# Patient Record
Sex: Male | Born: 1955 | Race: White | Hispanic: No | Marital: Single | Smoking: Never smoker
Health system: Southern US, Community
[De-identification: ages and names within clinical notes are randomized; demographics above are authoritative.]

## PROBLEM LIST (undated history)

## (undated) DIAGNOSIS — F419 Anxiety disorder, unspecified: Secondary | ICD-10-CM

## (undated) DIAGNOSIS — I10 Essential (primary) hypertension: Secondary | ICD-10-CM

## (undated) DIAGNOSIS — E119 Type 2 diabetes mellitus without complications: Secondary | ICD-10-CM

## (undated) DIAGNOSIS — L409 Psoriasis, unspecified: Secondary | ICD-10-CM

## (undated) DIAGNOSIS — F32A Depression, unspecified: Secondary | ICD-10-CM

## (undated) DIAGNOSIS — E109 Type 1 diabetes mellitus without complications: Secondary | ICD-10-CM

## (undated) HISTORY — PX: OTHER SURGICAL HISTORY: SHX169

## (undated) NOTE — *Deleted (*Deleted)
@  2000 discharge instructions given to pt and all questions answered. Pt presently changing and then awaiting his ride to confirm they are at hospital main entrance prior to NT transferring him downstairs for discharge.

---

## 2006-04-17 ENCOUNTER — Emergency Department (HOSPITAL_COMMUNITY): Admission: EM | Admit: 2006-04-17 | Discharge: 2006-04-17 | Payer: Self-pay | Admitting: Emergency Medicine

## 2006-05-10 ENCOUNTER — Emergency Department (HOSPITAL_COMMUNITY): Admission: EM | Admit: 2006-05-10 | Discharge: 2006-05-10 | Payer: Self-pay | Admitting: Emergency Medicine

## 2009-01-08 ENCOUNTER — Emergency Department (HOSPITAL_COMMUNITY): Admission: EM | Admit: 2009-01-08 | Discharge: 2009-01-08 | Payer: Self-pay | Admitting: Emergency Medicine

## 2009-08-20 ENCOUNTER — Emergency Department (HOSPITAL_COMMUNITY): Admission: EM | Admit: 2009-08-20 | Discharge: 2009-08-21 | Payer: Self-pay | Admitting: Emergency Medicine

## 2011-01-15 LAB — URINALYSIS, ROUTINE W REFLEX MICROSCOPIC
Glucose, UA: 100 mg/dL — AB
Specific Gravity, Urine: 1.008 (ref 1.005–1.030)
pH: 6.5 (ref 5.0–8.0)

## 2011-01-15 LAB — GLUCOSE, CAPILLARY
Glucose-Capillary: 122 mg/dL — ABNORMAL HIGH (ref 70–99)
Glucose-Capillary: 21 mg/dL — CL (ref 70–99)

## 2011-01-15 LAB — DIFFERENTIAL
Basophils Absolute: 0.2 10*3/uL — ABNORMAL HIGH (ref 0.0–0.1)
Eosinophils Relative: 4 % (ref 0–5)
Lymphocytes Relative: 29 % (ref 12–46)
Monocytes Absolute: 0.6 10*3/uL (ref 0.1–1.0)
Monocytes Relative: 6 % (ref 3–12)

## 2011-01-15 LAB — COMPREHENSIVE METABOLIC PANEL
AST: 31 U/L (ref 0–37)
Albumin: 3.8 g/dL (ref 3.5–5.2)
Alkaline Phosphatase: 71 U/L (ref 39–117)
Chloride: 98 mEq/L (ref 96–112)
GFR calc Af Amer: >60 mL/min (ref >60)
Potassium: 3.1 mEq/L — ABNORMAL LOW (ref 3.5–5.1)
Total Bilirubin: 1.3 mg/dL — ABNORMAL HIGH (ref 0.3–1.2)
Total Protein: 6.9 g/dL (ref 6.0–8.3)

## 2011-01-15 LAB — ETHANOL: Alcohol, Ethyl (B): 76 mg/dL — ABNORMAL HIGH (ref 0–10)

## 2011-01-15 LAB — CBC
Platelets: 181 10*3/uL (ref 150–400)
RDW: 14.7 % (ref 11.5–15.5)
WBC: 9.2 10*3/uL (ref 4.0–10.5)

## 2011-01-23 LAB — DIFFERENTIAL
Basophils Absolute: 0.1 10*3/uL (ref 0.0–0.1)
Eosinophils Relative: 2 % (ref 0–5)
Lymphocytes Relative: 18 % (ref 12–46)
Lymphs Abs: 1.5 10*3/uL (ref 0.7–4.0)
Monocytes Absolute: 0.7 10*3/uL (ref 0.1–1.0)
Monocytes Relative: 9 % (ref 3–12)
Neutro Abs: 5.7 10*3/uL (ref 1.7–7.7)

## 2011-01-23 LAB — CBC
HCT: 51.9 % (ref 39.0–52.0)
Hemoglobin: 17.7 g/dL — ABNORMAL HIGH (ref 13.0–17.0)
RBC: 5.2 MIL/uL (ref 4.22–5.81)
WBC: 8.3 10*3/uL (ref 4.0–10.5)

## 2011-01-23 LAB — POCT I-STAT, CHEM 8
BUN: 8 mg/dL (ref 6–23)
Calcium, Ion: 1.1 mmol/L — ABNORMAL LOW (ref 1.12–1.32)
Chloride: 95 mEq/L — ABNORMAL LOW (ref 96–112)
Creatinine, Ser: 0.8 mg/dL (ref 0.4–1.5)
TCO2: 26 mmol/L (ref 0–100)

## 2011-01-23 LAB — BASIC METABOLIC PANEL
Calcium: 9.3 mg/dL (ref 8.4–10.5)
GFR calc Af Amer: >60 mL/min (ref >60)
GFR calc non Af Amer: >60 mL/min (ref >60)
Glucose, Bld: 303 mg/dL — ABNORMAL HIGH (ref 70–99)
Potassium: 4.2 mEq/L (ref 3.5–5.1)
Sodium: 134 mEq/L — ABNORMAL LOW (ref 135–145)

## 2011-01-23 LAB — TRICYCLICS SCREEN, URINE: TCA Scrn: NOT DETECTED

## 2011-01-23 LAB — ETHANOL: Alcohol, Ethyl (B): <5 mg/dL (ref 0–10)

## 2011-01-23 LAB — RAPID URINE DRUG SCREEN, HOSP PERFORMED
Cocaine: NOT DETECTED
Tetrahydrocannabinol: POSITIVE — AB

## 2020-08-06 ENCOUNTER — Other Ambulatory Visit: Payer: Self-pay

## 2020-08-06 ENCOUNTER — Inpatient Hospital Stay (HOSPITAL_COMMUNITY)
Admission: EM | Admit: 2020-08-06 | Discharge: 2020-08-09 | DRG: 271 | Disposition: A | Discharge status: Home / Self Care | Payer: Self-pay | Attending: Internal Medicine | Admitting: Internal Medicine

## 2020-08-06 ENCOUNTER — Encounter (HOSPITAL_COMMUNITY): Payer: Self-pay

## 2020-08-06 ENCOUNTER — Emergency Department (HOSPITAL_COMMUNITY): Payer: Self-pay

## 2020-08-06 DIAGNOSIS — I96 Gangrene, not elsewhere classified: Secondary | ICD-10-CM

## 2020-08-06 DIAGNOSIS — F32A Depression, unspecified: Secondary | ICD-10-CM

## 2020-08-06 DIAGNOSIS — Z825 Family history of asthma and other chronic lower respiratory diseases: Secondary | ICD-10-CM

## 2020-08-06 DIAGNOSIS — I739 Peripheral vascular disease, unspecified: Secondary | ICD-10-CM

## 2020-08-06 DIAGNOSIS — I70221 Atherosclerosis of native arteries of extremities with rest pain, right leg: Secondary | ICD-10-CM | POA: Diagnosis present

## 2020-08-06 DIAGNOSIS — W208XXA Other cause of strike by thrown, projected or falling object, initial encounter: Secondary | ICD-10-CM | POA: Diagnosis present

## 2020-08-06 DIAGNOSIS — L97509 Non-pressure chronic ulcer of other part of unspecified foot with unspecified severity: Secondary | ICD-10-CM

## 2020-08-06 DIAGNOSIS — E10621 Type 1 diabetes mellitus with foot ulcer: Secondary | ICD-10-CM | POA: Diagnosis present

## 2020-08-06 DIAGNOSIS — E10649 Type 1 diabetes mellitus with hypoglycemia without coma: Secondary | ICD-10-CM | POA: Diagnosis present

## 2020-08-06 DIAGNOSIS — E1052 Type 1 diabetes mellitus with diabetic peripheral angiopathy with gangrene: Principal | ICD-10-CM | POA: Diagnosis present

## 2020-08-06 DIAGNOSIS — L03115 Cellulitis of right lower limb: Secondary | ICD-10-CM | POA: Diagnosis present

## 2020-08-06 DIAGNOSIS — L409 Psoriasis, unspecified: Secondary | ICD-10-CM | POA: Diagnosis present

## 2020-08-06 DIAGNOSIS — Z888 Allergy status to other drugs, medicaments and biological substances status: Secondary | ICD-10-CM

## 2020-08-06 DIAGNOSIS — Z833 Family history of diabetes mellitus: Secondary | ICD-10-CM

## 2020-08-06 DIAGNOSIS — Z79899 Other long term (current) drug therapy: Secondary | ICD-10-CM

## 2020-08-06 DIAGNOSIS — E1151 Type 2 diabetes mellitus with diabetic peripheral angiopathy without gangrene: Secondary | ICD-10-CM

## 2020-08-06 DIAGNOSIS — Z23 Encounter for immunization: Secondary | ICD-10-CM

## 2020-08-06 DIAGNOSIS — L600 Ingrowing nail: Secondary | ICD-10-CM | POA: Diagnosis present

## 2020-08-06 DIAGNOSIS — Z20822 Contact with and (suspected) exposure to covid-19: Secondary | ICD-10-CM | POA: Diagnosis present

## 2020-08-06 DIAGNOSIS — L0390 Cellulitis, unspecified: Secondary | ICD-10-CM | POA: Diagnosis present

## 2020-08-06 DIAGNOSIS — Z794 Long term (current) use of insulin: Secondary | ICD-10-CM

## 2020-08-06 DIAGNOSIS — L97519 Non-pressure chronic ulcer of other part of right foot with unspecified severity: Secondary | ICD-10-CM | POA: Diagnosis present

## 2020-08-06 HISTORY — DX: Type 1 diabetes mellitus without complications: E10.9

## 2020-08-06 HISTORY — DX: Type 2 diabetes mellitus without complications: E11.9

## 2020-08-06 HISTORY — DX: Psoriasis, unspecified: L40.9

## 2020-08-06 HISTORY — DX: Depression, unspecified: F32.A

## 2020-08-06 LAB — COMPREHENSIVE METABOLIC PANEL
ALT: 19 U/L (ref 0–44)
AST: 22 U/L (ref 15–41)
Albumin: 3.8 g/dL (ref 3.5–5.0)
Alkaline Phosphatase: 83 U/L (ref 38–126)
Anion gap: 13 (ref 5–15)
BUN: 12 mg/dL (ref 8–23)
CO2: 20 mmol/L — ABNORMAL LOW (ref 22–32)
Calcium: 9.1 mg/dL (ref 8.9–10.3)
Chloride: 101 mmol/L (ref 98–111)
Creatinine, Ser: 1.26 mg/dL — ABNORMAL HIGH (ref 0.61–1.24)
GFR, Estimated: >60 mL/min (ref >60)
Glucose, Bld: 150 mg/dL — ABNORMAL HIGH (ref 70–99)
Potassium: 3.9 mmol/L (ref 3.5–5.1)
Sodium: 134 mmol/L — ABNORMAL LOW (ref 135–145)
Total Bilirubin: 1.1 mg/dL (ref 0.3–1.2)
Total Protein: 6.6 g/dL (ref 6.5–8.1)

## 2020-08-06 LAB — RESPIRATORY PANEL BY RT PCR (FLU A&B, COVID)
Influenza A by PCR: NEGATIVE
Influenza B by PCR: NEGATIVE
SARS Coronavirus 2 by RT PCR: NEGATIVE

## 2020-08-06 LAB — CBC WITH DIFFERENTIAL/PLATELET
Abs Immature Granulocytes: 0.05 10*3/uL (ref 0.00–0.07)
Basophils Absolute: 0.2 10*3/uL — ABNORMAL HIGH (ref 0.0–0.1)
Basophils Relative: 2 %
Eosinophils Absolute: 0.6 10*3/uL — ABNORMAL HIGH (ref 0.0–0.5)
Eosinophils Relative: 5 %
HCT: 48.2 % (ref 39.0–52.0)
Hemoglobin: 16.1 g/dL (ref 13.0–17.0)
Immature Granulocytes: 0 %
Lymphocytes Relative: 16 %
Lymphs Abs: 1.8 10*3/uL (ref 0.7–4.0)
MCH: 30.4 pg (ref 26.0–34.0)
MCHC: 33.4 g/dL (ref 30.0–36.0)
MCV: 91.1 fL (ref 80.0–100.0)
Monocytes Absolute: 0.9 10*3/uL (ref 0.1–1.0)
Monocytes Relative: 8 %
Neutro Abs: 8 10*3/uL — ABNORMAL HIGH (ref 1.7–7.7)
Neutrophils Relative %: 69 %
Platelets: 346 10*3/uL (ref 150–400)
RBC: 5.29 MIL/uL (ref 4.22–5.81)
RDW: 12.7 % (ref 11.5–15.5)
WBC: 11.6 10*3/uL — ABNORMAL HIGH (ref 4.0–10.5)
nRBC: 0 % (ref 0.0–0.2)

## 2020-08-06 LAB — GLUCOSE, CAPILLARY
Glucose-Capillary: 47 mg/dL — ABNORMAL LOW (ref 70–99)
Glucose-Capillary: 57 mg/dL — ABNORMAL LOW (ref 70–99)
Glucose-Capillary: 113 mg/dL — ABNORMAL HIGH (ref 70–99)

## 2020-08-06 LAB — LACTIC ACID, PLASMA: Lactic Acid, Venous: 1.5 mmol/L (ref 0.5–1.9)

## 2020-08-06 LAB — MRSA PCR SCREENING: MRSA by PCR: NEGATIVE

## 2020-08-06 MED ORDER — SODIUM CHLORIDE 0.9 % IV SOLN: 2.0000 g | Freq: Once | INTRAVENOUS | Status: DC

## 2020-08-06 MED ORDER — LACTATED RINGERS IV BOLUS
1000.0000 mL | Freq: Once | INTRAVENOUS | Status: AC
  Administered 2020-08-06: 1000 mL via INTRAVENOUS

## 2020-08-06 MED ORDER — SODIUM CHLORIDE 0.9 % IV SOLN
2.0000 g | Freq: Three times a day (TID) | INTRAVENOUS | Status: DC
  Administered 2020-08-06 – 2020-08-09 (×9): 2 g via INTRAVENOUS
  Filled 2020-08-06 (×9): qty 2

## 2020-08-06 MED ORDER — VANCOMYCIN HCL 1750 MG/350ML IV SOLN
1750.0000 mg | Freq: Once | INTRAVENOUS | Status: AC
  Administered 2020-08-06: 1750 mg via INTRAVENOUS
  Filled 2020-08-06: qty 350

## 2020-08-06 MED ORDER — ONDANSETRON HCL 4 MG PO TABS: 4.0000 mg | ORAL_TABLET | Freq: Four times a day (QID) | ORAL | Status: DC | PRN

## 2020-08-06 MED ORDER — ADULT MULTIVITAMIN W/MINERALS CH
1.0000 | ORAL_TABLET | Freq: Every day | ORAL | Status: DC
  Administered 2020-08-07 – 2020-08-09 (×3): 1 via ORAL
  Filled 2020-08-06 (×3): qty 1

## 2020-08-06 MED ORDER — INFLUENZA VAC SPLIT QUAD 0.5 ML IM SUSY
0.5000 mL | PREFILLED_SYRINGE | INTRAMUSCULAR | Status: DC
  Filled 2020-08-06: qty 0.5

## 2020-08-06 MED ORDER — IBUPROFEN 400 MG PO TABS
400.0000 mg | ORAL_TABLET | ORAL | Status: DC | PRN
  Administered 2020-08-07 – 2020-08-09 (×8): 400 mg via ORAL
  Filled 2020-08-06 (×9): qty 1

## 2020-08-06 MED ORDER — MORPHINE SULFATE (PF) 2 MG/ML IV SOLN
1.0000 mg | INTRAVENOUS | Status: DC | PRN
  Administered 2020-08-06 – 2020-08-07 (×3): 1 mg via INTRAVENOUS
  Filled 2020-08-06 (×3): qty 1

## 2020-08-06 MED ORDER — PNEUMOCOCCAL VAC POLYVALENT 25 MCG/0.5ML IJ INJ
0.5000 mL | INJECTION | INTRAMUSCULAR | Status: DC
  Filled 2020-08-06: qty 0.5

## 2020-08-06 MED ORDER — ONDANSETRON HCL 4 MG/2ML IJ SOLN
4.0000 mg | Freq: Four times a day (QID) | INTRAMUSCULAR | Status: DC | PRN
  Administered 2020-08-09: 4 mg via INTRAVENOUS
  Filled 2020-08-06: qty 2

## 2020-08-06 MED ORDER — INSULIN ASPART 100 UNIT/ML ~~LOC~~ SOLN: 0.0000 [IU] | Freq: Three times a day (TID) | SUBCUTANEOUS | Status: DC

## 2020-08-06 MED ORDER — DIPHENHYDRAMINE HCL 25 MG PO CAPS: 25.0000 mg | ORAL_CAPSULE | Freq: Four times a day (QID) | ORAL | Status: DC | PRN

## 2020-08-06 MED ORDER — ENOXAPARIN SODIUM 40 MG/0.4ML ~~LOC~~ SOLN
40.0000 mg | SUBCUTANEOUS | Status: DC
  Administered 2020-08-06 – 2020-08-08 (×3): 40 mg via SUBCUTANEOUS
  Filled 2020-08-06 (×3): qty 0.4

## 2020-08-06 MED ORDER — VANCOMYCIN HCL 1500 MG/300ML IV SOLN
1500.0000 mg | INTRAVENOUS | Status: DC
  Filled 2020-08-06: qty 300

## 2020-08-06 MED ORDER — ACETAMINOPHEN 325 MG PO TABS
650.0000 mg | ORAL_TABLET | Freq: Four times a day (QID) | ORAL | Status: DC | PRN
  Administered 2020-08-07 – 2020-08-08 (×3): 650 mg via ORAL
  Filled 2020-08-06 (×3): qty 2

## 2020-08-06 MED ORDER — SODIUM CHLORIDE 0.9 % IV SOLN
INTRAVENOUS | Status: DC | PRN
  Administered 2020-08-06 – 2020-08-07 (×2): 250 mL via INTRAVENOUS
  Administered 2020-08-08: 500 mL via INTRAVENOUS

## 2020-08-06 MED ORDER — INSULIN NPH (HUMAN) (ISOPHANE) 100 UNIT/ML ~~LOC~~ SUSP
35.0000 [IU] | Freq: Every day | SUBCUTANEOUS | Status: DC
  Administered 2020-08-07: 35 [IU] via SUBCUTANEOUS
  Filled 2020-08-06: qty 10

## 2020-08-06 MED ORDER — VANCOMYCIN HCL IN DEXTROSE 1-5 GM/200ML-% IV SOLN: 1000.0000 mg | Freq: Once | INTRAVENOUS | Status: DC

## 2020-08-06 MED ORDER — ONDANSETRON HCL 4 MG/2ML IJ SOLN
4.0000 mg | Freq: Once | INTRAMUSCULAR | Status: AC
  Administered 2020-08-06: 4 mg via INTRAVENOUS
  Filled 2020-08-06: qty 2

## 2020-08-06 MED ORDER — PIPERACILLIN-TAZOBACTAM 3.375 G IVPB 30 MIN
3.3750 g | Freq: Once | INTRAVENOUS | Status: AC
  Administered 2020-08-06: 3.375 g via INTRAVENOUS
  Filled 2020-08-06: qty 50

## 2020-08-06 MED ORDER — MORPHINE SULFATE (PF) 4 MG/ML IV SOLN
4.0000 mg | Freq: Once | INTRAVENOUS | Status: AC
  Administered 2020-08-06: 4 mg via INTRAVENOUS
  Filled 2020-08-06: qty 1

## 2020-08-06 MED ORDER — ACETAMINOPHEN 650 MG RE SUPP: 650.0000 mg | Freq: Four times a day (QID) | RECTAL | Status: DC | PRN

## 2020-08-06 NOTE — Progress Notes (Signed)
A consult was received from an ED physician for vancomycin  per pharmacy dosing.  The patient's profile has been reviewed for ht/wt/allergies/indication/available labs.    A one time order has been placed for vancomycin 1750 mg IV x1.  Further antibiotics/pharmacy consults should be ordered by admitting physician if indicated.                       Thank you, Lucia Gaskins 08/06/2020  2:36 PM

## 2020-08-06 NOTE — ED Triage Notes (Signed)
Patient c/o right great toe "being necrotic." Patient has redness of the right foot. Patient states he has been taking Cipro and "another antibiotic." Patient went to an UC and was told to come to the ED.

## 2020-08-06 NOTE — Consult Note (Signed)
Chief Complaint: Necrotic right great toe  History: John Nguyen is a 64 y.o. male, with a history of type I DM, presenting to the ED with concern for infection to the right foot worsening over the last couple weeks.  Patient states he began to have pain in the right foot 2 to 3 weeks ago.  He noted a wound from which he was able to express purulent discharge.   He was seen at urgent care on October 15, prescribed Bactrim and Cipro.  X-ray at that time unremarkable, per radiologist read in Care Everywhere. Patient return to the urgent care today due to spreading redness from the toe into the foot and lower leg as well as development and spreading of area of blackened tissue on the right big toe.  Moderate to severe throbbing pain in the right toe.  He states he did injure his foot, but not recently.  The incident where he dropped a box on his foot occurred about a year ago.  Patient has not had Covid vaccination.  He does not have a primary care provider.   Review of systems Denies fever/chills, abdominal pain, chest pain, other extremity pain/swelling, shortness of breath, N/V/D, or any other complaints.  Refer to past medical history for remainder of pertinent positives.   Past Medical History:  Diagnosis Date  . Depression   . Diabetes mellitus without complication (HCC)   . Psoriasis   . Type 1 diabetes mellitus (HCC)     Allergies  Allergen Reactions  . Cymbalta [Duloxetine Hcl] Rash    No current facility-administered medications on file prior to encounter.   Current Outpatient Medications on File Prior to Encounter  Medication Sig Dispense Refill  . ciprofloxacin (CIPRO) 500 MG tablet Take 500 mg by mouth 2 (two) times daily. Start date : 1015/21    . diphenhydrAMINE (BENADRYL) 25 mg capsule Take 25 mg by mouth every 6 (six) hours as needed for itching or allergies.    Marland Kitchen ibuprofen (ADVIL) 200 MG tablet Take 800 mg by mouth 2 (two) times daily as needed for  moderate pain.    . indomethacin (INDOCIN) 25 MG capsule Take 25 mg by mouth 2 (two) times daily with a meal. Start  Date : 07/27/20    . insulin NPH Human (NOVOLIN N) 100 UNIT/ML injection Inject 35 Units into the skin daily.     . Multiple Vitamin (MULTIVITAMIN WITH MINERALS) TABS tablet Take 1 tablet by mouth daily.    Marland Kitchen sulfamethoxazole-trimethoprim (BACTRIM DS) 800-160 MG tablet Take 1 tablet by mouth 2 (two) times daily. Start date : 07/27/20      Physical Exam: Vitals:   08/06/20 1636 08/06/20 1700  BP: (!) 162/103 (!) 157/93  Pulse: 92 96  Resp: 17 16  Temp:    SpO2: 98% 100%   Body mass index is 27.02 kg/m. He is alert and oriented x3 No shortness of breath or chest pain.  Lungs are clear to auscultation. Cardiac: Regular rate and rhythm no rubs gallops murmurs Abdomen soft and nontender.  No loss of bowel bladder control, no rebound tenderness Right lower extremity: 1+ dorsalis pedis/posterior tibialis pulses.  Positive ecchymosis/erythema starting at the foot and radiating up to about the mid calf.  Compartments are soft and nontender.  Intact EHL/tibialis anterior/gastrocnemius function.  Necrotic black great toe.  Insensate to palpation.  No other open lacerations are appreciated.  Image: DG Foot Complete Right  Result Date: 08/06/2020 CLINICAL DATA:  Cellulitis,  first ray necrosis EXAM: RIGHT FOOT COMPLETE - 3+ VIEW COMPARISON:  Radiograph 07/27/2020 FINDINGS: Soft tissue swelling of the first digit without clear evidence of osteomyelitis. Small cortical erosion noted at the head of the fifth metatarsal, nonspecific in can be seen with sequela of prior infection/osteomyelitis as well as erosive arthropathy. Redemonstrated linear foreign body seen along the plantar aspect at the metatarsal heads and at the plantar aspect of the first interphalangeal joint. Progressive clawtoe deformities of the second through fifth ray. Additional varus deformity of the fifth ray noted,  progressive from prior imaging. IMPRESSION: 1. Soft tissue swelling of the first digit without clear evidence of osteomyelitis. 2. Small cortical erosion noted at the head of the fifth metatarsal, nonspecific in can be seen with sequela of prior infection/osteomyelitis as well as erosive arthropathy. 3. Stable indeterminate foreign bodies in the volar aspect of the first and fifth rays. 4. Quintus varus and progressive clawtoe deformities of the second through fifth ray. Electronically Signed   By: Kreg Shropshire M.D.   On: 08/06/2020 15:52    A/P: John Nguyen is a pleasant 64 year old diabetic with a necrotic great toe for at least 2 weeks now.  Patient states it has been getting worse and so he elected to come to the emergency room today because of concerns for infection.  At this point time patient will more than likely require surgical debridement.  I have contacted Dr. [name] and he will take over care tomorrow and discuss definitive treatment.  Recommend admission to the medical service for IV antibiotic therapy.  If there is any further questions or concerns please do not hesitate to contact me.

## 2020-08-06 NOTE — ED Provider Notes (Signed)
[facility]-EMERGENCY DEPT Provider Note   CSN: [id number] Arrival date & time: 08/06/20  1325     History Chief Complaint  Patient presents with  . Foot Pain    John Nguyen is a 63 y.o. male.  HPI      John Nguyen is a 64 y.o. male, with a history of type I DM, presenting to the ED with concern for infection to the right foot worsening over the last couple weeks.  Patient states he began to have pain in the right foot 2 to 3 weeks ago.  He noted a wound from which he was able to express purulent discharge.   He was seen at urgent care on October 15, prescribed Bactrim and Cipro.  X-ray at that time unremarkable, per radiologist read in Care Everywhere. Patient return to the urgent care today due to spreading redness from the toe into the foot and lower leg as well as development and spreading of area of blackened tissue on the right big toe.  Moderate to severe throbbing pain in the right toe.  He states he did injure his foot, but not recently.  The incident where he dropped a box on his foot occurred about a year ago.  Patient has not had Covid vaccination.  He does not have a primary care provider.  Denies fever/chills, abdominal pain, chest pain, other extremity pain/swelling, shortness of breath, N/V/D, or any other complaints.   Past Medical History:  Diagnosis Date  . Depression   . Diabetes mellitus without complication (HCC)   . Psoriasis   . Type 1 diabetes mellitus [facility])     Patient Active Problem List   Diagnosis Date Noted  . Cellulitis 08/06/2020    Past Surgical History:  Procedure Laterality Date  . arm fracture surgery Left        Family History  Problem Relation Age of Onset  . Diabetes Mother   . Cancer Mother   . COPD Father     Social History   Tobacco Use  . Smoking status: Never Smoker  . Smokeless tobacco: Never Used  Vaping Use  . Vaping Use: Never used  Substance Use Topics  . Alcohol use: Yes     Comment: daily use  . Drug use: Yes    Types: Marijuana    Home Medications Prior to Admission medications   Medication Sig Start Date End Date Taking? Authorizing Provider  ciprofloxacin (CIPRO) 500 MG tablet Take 500 mg by mouth 2 (two) times daily. Start date : 1015/21 07/27/20  Yes [provider]  diphenhydrAMINE (BENADRYL) 25 mg capsule Take 25 mg by mouth every 6 (six) hours as needed for itching or allergies.   Yes [provider]  ibuprofen (ADVIL) 200 MG tablet Take 800 mg by mouth 2 (two) times daily as needed for moderate pain.   Yes [provider]  indomethacin (INDOCIN) 25 MG capsule Take 25 mg by mouth 2 (two) times daily with a meal. Start  Date : 07/27/20 07/27/20  Yes [provider]  insulin NPH Human (NOVOLIN N) 100 UNIT/ML injection Inject 35 Units into the skin daily.    Yes [provider]  Multiple Vitamin (MULTIVITAMIN WITH MINERALS) TABS tablet Take 1 tablet by mouth daily.   Yes [provider]  sulfamethoxazole-trimethoprim (BACTRIM DS) 800-160 MG tablet Take 1 tablet by mouth 2 (two) times daily. Start date : 07/27/20 07/27/20  Yes [provider]    Allergies  Cymbalta [duloxetine hcl]  Review of Systems   Review of Systems  Constitutional: Negative for chills and fever.  Respiratory: Negative for shortness of breath.   Cardiovascular: Negative for chest pain.  Gastrointestinal: Negative for abdominal pain, diarrhea, nausea and vomiting.  Skin: Positive for color change and wound.  Neurological: Positive for numbness. Negative for weakness.  All other systems reviewed and are negative.   Physical Exam Updated Vital Signs BP (!) 173/98 (BP Location: Left Arm)   Pulse (!) 105   Temp 98.3 F (36.8 C) (Oral)   Resp 16   Ht  (1.753 m)   Wt 83 kg   SpO2 100%   BMI 27.02 kg/m   Physical Exam Vitals and nursing note reviewed.  Constitutional:      General: He is not in acute  distress.    Appearance: He is well-developed. He is not diaphoretic.  HENT:     Head: Normocephalic and atraumatic.     Mouth/Throat:     Mouth: Mucous membranes are moist.     Pharynx: Oropharynx is clear.  Eyes:     Conjunctiva/sclera: Conjunctivae normal.  Cardiovascular:     Rate and Rhythm: Normal rate and regular rhythm.     Pulses: Normal pulses.          Radial pulses are 2+ on the right side and 2+ on the left side.       Posterior tibial pulses are 2+ on the right side and 2+ on the left side.     Heart sounds: Normal heart sounds.  Pulmonary:     Effort: Pulmonary effort is normal. No respiratory distress.     Breath sounds: Normal breath sounds.  Abdominal:     Palpations: Abdomen is soft.     Tenderness: There is no abdominal tenderness. There is no guarding.  Musculoskeletal:     Cervical back: Neck supple.     Comments: Blackened area to the right first toe, as shown.  This area is cool to the touch.  Erythema extending throughout the toes, foot, and right lower leg to about mid calf with swelling.  Tenderness in the right great toe.  Skin:    General: Skin is warm and dry.  Neurological:     Mental Status: He is alert.     Comments: No sensation light touch in the tip of the right great toe.  Sensation to light touch present in the proximal right great toe as well as the other toes of the right foot.  Motor function intact in the right toes and ankle.  Psychiatric:        Mood and Affect: Mood and affect normal.        Speech: Speech normal.        Behavior: Behavior normal.                      ED Results / Procedures / Treatments   Labs (all labs ordered are listed, but only abnormal results are displayed) Labs Reviewed  COMPREHENSIVE METABOLIC PANEL - Abnormal; Notable for the following components:      Result Value   Sodium 134 (*)    CO2 20 (*)    Glucose, Bld 150 (*)    Creatinine, Ser 1.26 (*)    All other components within normal  limits  CBC WITH DIFFERENTIAL/PLATELET - Abnormal; Notable for the following components:   WBC 11.6 (*)    Neutro Abs 8.0 (*)  Eosinophils Absolute 0.6 (*)    Basophils Absolute 0.2 (*)    All other components within normal limits  RESPIRATORY PANEL BY RT PCR (FLU A&B, COVID)  MRSA PCR SCREENING  CULTURE, BLOOD (ROUTINE X 2)  CULTURE, BLOOD (ROUTINE X 2)  LACTIC ACID, PLASMA  LACTIC ACID, PLASMA   BUN  Date Value Ref Range Status  08/06/2020 12 8 - 23 mg/dL Final  38/75/6433 11 6 - 23 mg/dL Final  29/51/8841 8 6 - 23 mg/dL Final  66/03/3015 8 6 - 23 mg/dL Final   Creatinine, Ser  Date Value Ref Range Status  08/06/2020 1.26 (H) 0.61 - 1.24 mg/dL Final  10/21/3233 5.73 0.40 - 1.50 mg/dL Final  22/11/5425 0.8 0.62 - 1.50 mg/dL Final  37/62/8315 1.76 0.40 - 1.50 mg/dL Final     EKG None  Radiology DG Foot Complete Right  Result Date: 08/06/2020 CLINICAL DATA:  Cellulitis, first ray necrosis EXAM: RIGHT FOOT COMPLETE - 3+ VIEW COMPARISON:  Radiograph 07/27/2020 FINDINGS: Soft tissue swelling of the first digit without clear evidence of osteomyelitis. Small cortical erosion noted at the head of the fifth metatarsal, nonspecific in can be seen with sequela of prior infection/osteomyelitis as well as erosive arthropathy. Redemonstrated linear foreign body seen along the plantar aspect at the metatarsal heads and at the plantar aspect of the first interphalangeal joint. Progressive clawtoe deformities of the second through fifth ray. Additional varus deformity of the fifth ray noted, progressive from prior imaging. IMPRESSION: 1. Soft tissue swelling of the first digit without clear evidence of osteomyelitis. 2. Small cortical erosion noted at the head of the fifth metatarsal, nonspecific in can be seen with sequela of prior infection/osteomyelitis as well as erosive arthropathy. 3. Stable indeterminate foreign bodies in the volar aspect of the first and fifth rays. 4. Quintus varus  and progressive clawtoe deformities of the second through fifth ray. Electronically Signed   By: Kreg Shropshire M.D.   On: 08/06/2020 15:52    Procedures Procedures (including critical care time)  Medications Ordered in ED Medications  vancomycin (VANCOREADY) IVPB 1750 mg/350 mL (1,750 mg Intravenous New Bag/Given 08/06/20 1544)  influenza vac split quadrivalent PF (FLUARIX) injection 0.5 mL (has no administration in time range)  pneumococcal 23 valent vaccine (PNEUMOVAX-23) injection 0.5 mL (has no administration in time range)  piperacillin-tazobactam (ZOSYN) IVPB 3.375 g (0 g Intravenous Stopped 08/06/20 1547)  lactated ringers bolus 1,000 mL (1,000 mLs Intravenous New Bag/Given 08/06/20 1513)  morphine 4 MG/ML injection 4 mg (4 mg Intravenous Given 08/06/20 1555)  ondansetron (ZOFRAN) injection 4 mg (4 mg Intravenous Given 08/06/20 1513)    ED Course  I have reviewed the triage vital signs and the nursing notes.  Pertinent labs & imaging results that were available during my care of the patient were reviewed by me and considered in my medical decision making (see chart for details).  Clinical Course as of Aug 06 1708  Mon Aug 06, 2020  1437 Spoke with Dr. Shon Baton, orthopedic surgeon. Discussed patient's symptoms and physical exam findings. He will follow on the patient.    [SJ]  1642 Spoke with Dr. Ella Jubilee, hospitalist. Agrees to admit patient.    [SJ]    Clinical Course User Index [SJ] Skylor Hughson C, PA-C   MDM Rules/Calculators/A&P                          Patient presents with blackening coloration to the right great toe worsening  over the last couple weeks.  He also has worsening and spreading of erythema and swelling, despite antibiotic therapy. Patient is nontoxic appearing, afebrile, not tachycardic on my exam, not tachypneic, not hypotensive, maintains excellent SPO2 on room air, and is in no apparent distress.   I have reviewed the patient's chart to obtain more  information.   I reviewed and interpreted the patient's labs and radiological studies. No definitive evidence of osteomyelitis on x-ray. Mild leukocytosis. Negative for lactic acidosis.  Concern for progressive cellulitis despite antibiotic therapy as well as necrosis.   Findings and plan of care discussed with Melene Plan, DO. Dr. Adela Lank personally evaluated and examined this patient.  Final Clinical Impression(s) / ED Diagnoses Final diagnoses:  Cellulitis of right lower extremity  Skin necrosis [facility])    Rx / DC Orders ED Discharge Orders    None       Concepcion Living 08/06/20 1712    Melene Plan, DO 08/07/20 [phone]

## 2020-08-06 NOTE — Plan of Care (Signed)
  Problem: Education: Goal: Knowledge of General Education information will improve Description Including pain rating scale, medication(s)/side effects and non-pharmacologic comfort measures Outcome: Progressing   

## 2020-08-06 NOTE — H&P (Signed)
History and Physical    John Nguyen OHY:[id number] DOB: [birth year] DOA: 08/06/2020  PCP: Pcp, No   Patient coming from: home / urgent care   Chief Complaint:  Right foot erythema and edema.   HPI: John Nguyen is a 64 y.o. male with medical history significant of type 1 diabetes mellitus.  Patient sustained a bilateral feet injury about 3 weeks ago after a heavy object landed on both feet.  He lost the toenail of his first metatarsals bilaterally.  On the left it regrowth with no problems, on the right it developed progressive edema, erythema and local tenderness.  Apparently he developed a right great toe ingrown nail producing further injury.    He applied locally Neosporin, alcohol and gauze, 7 days later he was able to drain pus material by compressing his first toe.  His symptoms continue to progress, about 11 days after his initial injury he was seen at urgent care clinic, radiographs were taken and he had oral antibiotic prescribed, ciprofloxacin/Bactrim along with ibuprofen. His right foot erythema partially improved but his right great toe continue to worsen, over the last 2 days had necrotic changes at the tip with worsening edema and erythema. Today after completing 10 days of oral antibiotic therapy, he follow-up with urgent care, due to worsening injuries he was referred to the hospital for further evaluation.  He does have pain at his right foot, moderate to severe intensity, worse to touch and ambulation, associated with local erythema, there is no radiation, or improving factors.  He reports subjective chills but no frank fevers, no nausea no vomiting.  His glucose has been elevated lately despite aggressive insulin therapy.   ED Course: patient had IV vancomycin and zosyn, 1000 ml LR IV and referred for admission.   Review of Systems:  1. General: No fevers, positive subjective chills, no weight gain or weight loss 2. ENT: No runny nose or sore throat, no hearing  disturbances 3. Pulmonary: No dyspnea, cough, wheezing, or hemoptysis 4. Cardiovascular: No angina, claudication, lower extremity edema, pnd or orthopnea 5. Gastrointestinal: No nausea or vomiting, no diarrhea or constipation 6. Hematology: No easy bruisability or frequent infections 7. Urology: No dysuria, hematuria or increased urinary frequency 8. Dermatology: right foot injury as mentioned in the HPI.  9. Neurology: No seizures or paresthesias 10. Musculoskeletal: No joint pain or deformities  Past Medical History:  Diagnosis Date  . Depression   . Diabetes mellitus without complication (HCC)   . Psoriasis   . Type 1 diabetes mellitus (HCC)     Past Surgical History:  Procedure Laterality Date  . arm fracture surgery Left      reports that he has never smoked. He has never used smokeless tobacco. He reports current alcohol use. He reports current drug use. Drug: Marijuana.  Allergies  Allergen Reactions  . Cymbalta [Duloxetine Hcl] Rash    Family History  Problem Relation Age of Onset  . Diabetes Mother   . Cancer Mother   . COPD Father      Prior to Admission medications   Medication Sig Start Date End Date Taking? Authorizing Provider  ciprofloxacin (CIPRO) 500 MG tablet Take 500 mg by mouth 2 (two) times daily. Start date : 1015/21 07/27/20  Yes [provider]  diphenhydrAMINE (BENADRYL) 25 mg capsule Take 25 mg by mouth every 6 (six) hours as needed for itching or allergies.   Yes [provider]  ibuprofen (ADVIL) 200 MG tablet Take 800 mg by mouth 2 (  two) times daily as needed for moderate pain.   Yes [provider]  indomethacin (INDOCIN) 25 MG capsule Take 25 mg by mouth 2 (two) times daily with a meal. Start  Date : 07/27/20 07/27/20  Yes [provider]  insulin NPH Human (NOVOLIN N) 100 UNIT/ML injection Inject 35 Units into the skin daily.    Yes [provider]  Multiple Vitamin (MULTIVITAMIN WITH MINERALS)  TABS tablet Take 1 tablet by mouth daily.   Yes [provider]  sulfamethoxazole-trimethoprim (BACTRIM DS) 800-160 MG tablet Take 1 tablet by mouth 2 (two) times daily. Start date : 07/27/20 07/27/20  Yes [provider]    Physical Exam: Vitals:   08/06/20 1521 08/06/20 1530 08/06/20 1600 08/06/20 1636  BP: (!) 142/70 (!) 157/86 (!) 155/90 (!) 162/103  Pulse: 95 95 94 92  Resp: 15 12 14 17   Temp:      TempSrc:      SpO2: 100% 99% 100% 98%  Weight:      Height:        Vitals:   08/06/20 1521 08/06/20 1530 08/06/20 1600 08/06/20 1636  BP: (!) 142/70 (!) 157/86 (!) 155/90 (!) 162/103  Pulse: 95 95 94 92  Resp: 15 12 14 17   Temp:      TempSrc:      SpO2: 100% 99% 100% 98%  Weight:      Height:       General: deconditioned and ill looking appearing  Neurology: Awake and alert, non focal Head and Neck. Head normocephalic. Neck supple with no adenopathy or thyromegaly.   E ENT: no pallor, no icterus, oral mucosa moist Cardiovascular: No JVD. S1-S2 present, rhythmic, no gallops, rubs, or murmurs. No lower extremity edema. Pulmonary: positive breath sounds bilaterally, no wheezing, rhonchi or rales. Gastrointestinal. Abdomen soft and non tender Skin. Right foot first toe with edema and erythema, tender to palpation, positive necrosis at the tip. Faint erythema distal leg.  Musculoskeletal: no joint deformities        Labs on Admission: I have personally reviewed following labs and imaging studies  CBC: Recent Labs  Lab 08/06/20 1410  WBC 11.6*  NEUTROABS 8.0*  HGB 16.1  HCT 48.2  MCV 91.1  PLT 346   Basic Metabolic Panel: Recent Labs  Lab 08/06/20 1410  NA 134*  K 3.9  CL 101  CO2 20*  GLUCOSE 150*  BUN 12  CREATININE 1.26*  CALCIUM 9.1   GFR: Estimated Creatinine Clearance: 60.8 mL/min (A) (by C-G formula based on SCr of 1.26 mg/dL (H)). Liver Function Tests: Recent Labs  Lab 08/06/20 1410  AST 22  ALT 19  ALKPHOS 83  BILITOT  1.1  PROT 6.6  ALBUMIN 3.8   No results for input(s): LIPASE, AMYLASE in the last 168 hours. No results for input(s): AMMONIA in the last 168 hours. Coagulation Profile: No results for input(s): INR, PROTIME in the last 168 hours. Cardiac Enzymes: No results for input(s): CKTOTAL, CKMB, CKMBINDEX, TROPONINI in the last 168 hours. BNP (last 3 results) No results for input(s): PROBNP in the last 8760 hours. HbA1C: No results for input(s): HGBA1C in the last 72 hours. CBG: No results for input(s): GLUCAP in the last 168 hours. Lipid Profile: No results for input(s): CHOL, HDL, LDLCALC, TRIG, CHOLHDL, LDLDIRECT in the last 72 hours. Thyroid Function Tests: No results for input(s): TSH, T4TOTAL, FREET4, T3FREE, THYROIDAB in the last 72 hours. Anemia Panel: No results for input(s): VITAMINB12, FOLATE, FERRITIN, TIBC,  IRON, RETICCTPCT in the last 72 hours. Urine analysis:    Component Value Date/Time   COLORURINE YELLOW 08/20/2009 2321   APPEARANCEUR CLEAR 08/20/2009 2321   LABSPEC 1.008 08/20/2009 2321   PHURINE 6.5 08/20/2009 2321   GLUCOSEU 100 (A) 08/20/2009 2321   HGBUR NEGATIVE 08/20/2009 2321   BILIRUBINUR NEGATIVE 08/20/2009 2321   KETONESUR NEGATIVE 08/20/2009 2321   PROTEINUR NEGATIVE 08/20/2009 2321   UROBILINOGEN 0.2 08/20/2009 2321   NITRITE NEGATIVE 08/20/2009 2321   LEUKOCYTESUR  08/20/2009 2321    NEGATIVE MICROSCOPIC NOT DONE ON URINES WITH NEGATIVE PROTEIN, BLOOD, LEUKOCYTES, NITRITE, OR GLUCOSE <1000 mg/dL.    Radiological Exams on Admission: DG Foot Complete Right  Result Date: 08/06/2020 CLINICAL DATA:  Cellulitis, first ray necrosis EXAM: RIGHT FOOT COMPLETE - 3+ VIEW COMPARISON:  Radiograph 07/27/2020 FINDINGS: Soft tissue swelling of the first digit without clear evidence of osteomyelitis. Small cortical erosion noted at the head of the fifth metatarsal, nonspecific in can be seen with sequela of prior infection/osteomyelitis as well as erosive  arthropathy. Redemonstrated linear foreign body seen along the plantar aspect at the metatarsal heads and at the plantar aspect of the first interphalangeal joint. Progressive clawtoe deformities of the second through fifth ray. Additional varus deformity of the fifth ray noted, progressive from prior imaging. IMPRESSION: 1. Soft tissue swelling of the first digit without clear evidence of osteomyelitis. 2. Small cortical erosion noted at the head of the fifth metatarsal, nonspecific in can be seen with sequela of prior infection/osteomyelitis as well as erosive arthropathy. 3. Stable indeterminate foreign bodies in the volar aspect of the first and fifth rays. 4. Quintus varus and progressive clawtoe deformities of the second through fifth ray. Electronically Signed   By: Kreg ShropshirePrice  DeHay M.D.   On: 08/06/2020 15:52    EKG: Independently reviewed. NA   Assessment/Plan Principal Problem:   Cellulitis Active Problems:   Type 1 diabetes mellitus with foot ulcer (HCC)   Psoriasis   Depression   64 year old male with past medical history of type 1 diabetes mellitus, he presents with worsening right foot injury for the last 3 weeks, despite oral antibiotic therapy for 10 days.  Lesion predominantly at the distal right foot, first metatarsal.  Positive necrotic changes.  On his initial physical examination blood pressure 157/93, heart rate 96, respiratory rate 16, oxygen saturation 100%.  Moist mucous membranes, lungs clear to auscultation bilaterally, heart S1-S2, present rhythm, soft abdomen, no lower extremity edema.  Significant injury right foot, erythema, edema, first metatarsal necrotic changes. Sodium 134, potassium 3.9, chloride 101, bicarb 20, glucose 150, BUN 12, creatinine 1.26, white count 11.6, hemoglobin 16.1, hematocrit 48.2, platelets 346. SARS COVID-19 negative.  Right foot x-ray with soft tissue swelling of the first digit without clear evidence of osteomyelitis.  Small cortical erosion  noted at the head of the fifth metatarsal nonspecific.  Stable indeterminate foreign bodies in the volar aspect of the first and fifth rays.  Patient admitted to the hospital working diagnosis of worsening cellulitis, right foot, failed outpatient antibiotic therapy.  1.  Right foot, great toe cellulitis.  Admit patient to the medical ward, continue broad-spectrum IV antibiotic with vancomycin and cefepime (followe order set recommendations/ severe non purulent cellulitis). Pain control with intravenous morphine, oral ibuprofen and acetaminophen. Further work-up with right foot MRI.  Orthopedics has been consulted in the ED.  2.  Type 1 diabetes mellitus.  Admission glucose 150, apparently it has been difficult to control in the outpatient. Continue basal  insulin 35 units daily and sliding scale, further adjustments depending on insulin requirements.  3.  Depression.  No confusion or agitation.  4.  Psoriasis.  Follow-up as an outpatient.     DVT prophylaxis: Enoxaparin   Code Status:   full  Family Communication:  No family at the bedside     Consults called:  Orthopedics per ED   Admission status:  Inpatient.    Jaydn Fincher Annett Gula MD [facility]   08/06/2020, 4:39 PM

## 2020-08-06 NOTE — Progress Notes (Signed)
Pharmacy Antibiotic Note  John Nguyen is a 64 y.o. male with hx DM and right great toe wound/infection presented to the ED on 08/06/2020 with worsening of infection. Foot X-ray on 10/25 showed "soft tissue swelling of the first digit without clear evidence of osteomyelitis.  Pharmacy is consulted to dose vancomycin and cefepime for necrotic great toe.  - zosyn 3.375 gm IV x1 given in the ED on 10/25 at 1500  Plan: - cefepime 2gm IV q8h - vancomycin 1750 mg IV x1 given in the ED at 1544, then 1500 mg IV q24h  _________________________________  Height: 5\' 9"  (175.3 cm) Weight: 83 kg (183 lb) IBW/kg (Calculated) : 70.7  Temp (24hrs), Avg:98.3 F (36.8 C), Min:98.3 F (36.8 C), Max:98.3 F (36.8 C)  Recent Labs  Lab 08/06/20 1410  WBC 11.6*  CREATININE 1.26*  LATICACIDVEN 1.5    Estimated Creatinine Clearance: 60.8 mL/min (A) (by C-G formula based on SCr of 1.26 mg/dL (H)).    Allergies  Allergen Reactions  . Cymbalta [Duloxetine Hcl] Rash     Thank you for allowing pharmacy to be a part of this patient's care.  08/08/20 08/06/2020 5:34 PM

## 2020-08-06 NOTE — ED Notes (Signed)
Report called to Amy RN. Pt to floor via w/c

## 2020-08-07 ENCOUNTER — Inpatient Hospital Stay (HOSPITAL_COMMUNITY): Payer: Self-pay

## 2020-08-07 DIAGNOSIS — E119 Type 2 diabetes mellitus without complications: Secondary | ICD-10-CM

## 2020-08-07 DIAGNOSIS — I96 Gangrene, not elsewhere classified: Secondary | ICD-10-CM

## 2020-08-07 DIAGNOSIS — L03115 Cellulitis of right lower limb: Secondary | ICD-10-CM

## 2020-08-07 DIAGNOSIS — I739 Peripheral vascular disease, unspecified: Secondary | ICD-10-CM

## 2020-08-07 DIAGNOSIS — I70261 Atherosclerosis of native arteries of extremities with gangrene, right leg: Secondary | ICD-10-CM

## 2020-08-07 DIAGNOSIS — E1151 Type 2 diabetes mellitus with diabetic peripheral angiopathy without gangrene: Secondary | ICD-10-CM

## 2020-08-07 LAB — HEMOGLOBIN A1C
Hgb A1c MFr Bld: 8.2 % — ABNORMAL HIGH (ref 4.8–5.6)
Mean Plasma Glucose: 189 mg/dL

## 2020-08-07 LAB — CBC
HCT: 48.4 % (ref 39.0–52.0)
Hemoglobin: 15.9 g/dL (ref 13.0–17.0)
MCH: 30.5 pg (ref 26.0–34.0)
MCHC: 32.9 g/dL (ref 30.0–36.0)
MCV: 92.7 fL (ref 80.0–100.0)
Platelets: 344 10*3/uL (ref 150–400)
RBC: 5.22 MIL/uL (ref 4.22–5.81)
RDW: 12.8 % (ref 11.5–15.5)
WBC: 10.8 10*3/uL — ABNORMAL HIGH (ref 4.0–10.5)
nRBC: 0 % (ref 0.0–0.2)

## 2020-08-07 LAB — GLUCOSE, CAPILLARY
Glucose-Capillary: 86 mg/dL (ref 70–99)
Glucose-Capillary: 104 mg/dL — ABNORMAL HIGH (ref 70–99)
Glucose-Capillary: 150 mg/dL — ABNORMAL HIGH (ref 70–99)
Glucose-Capillary: 85 mg/dL (ref 70–99)

## 2020-08-07 LAB — BASIC METABOLIC PANEL
Anion gap: 12 (ref 5–15)
BUN: 9 mg/dL (ref 8–23)
CO2: 20 mmol/L — ABNORMAL LOW (ref 22–32)
Calcium: 9.1 mg/dL (ref 8.9–10.3)
Chloride: 106 mmol/L (ref 98–111)
Creatinine, Ser: 1.09 mg/dL (ref 0.61–1.24)
GFR, Estimated: >60 mL/min (ref >60)
Glucose, Bld: 93 mg/dL (ref 70–99)
Potassium: 4.6 mmol/L (ref 3.5–5.1)
Sodium: 138 mmol/L (ref 135–145)

## 2020-08-07 MED ORDER — MORPHINE SULFATE (PF) 2 MG/ML IV SOLN
2.0000 mg | INTRAVENOUS | Status: DC | PRN
  Administered 2020-08-07 – 2020-08-09 (×6): 2 mg via INTRAVENOUS
  Filled 2020-08-07 (×6): qty 1

## 2020-08-07 MED ORDER — POLYETHYLENE GLYCOL 3350 17 G PO PACK
17.0000 g | PACK | Freq: Two times a day (BID) | ORAL | Status: DC
  Administered 2020-08-07: 17 g via ORAL
  Filled 2020-08-07 (×3): qty 1

## 2020-08-07 MED ORDER — VANCOMYCIN HCL IN DEXTROSE 1-5 GM/200ML-% IV SOLN
1000.0000 mg | Freq: Two times a day (BID) | INTRAVENOUS | Status: DC
  Administered 2020-08-07 – 2020-08-08 (×2): 1000 mg via INTRAVENOUS
  Filled 2020-08-07 (×5): qty 200

## 2020-08-07 MED ORDER — ATORVASTATIN CALCIUM 10 MG PO TABS
20.0000 mg | ORAL_TABLET | Freq: Every day | ORAL | Status: DC
  Administered 2020-08-07 – 2020-08-09 (×3): 20 mg via ORAL
  Filled 2020-08-07 (×2): qty 2
  Filled 2020-08-07: qty 1

## 2020-08-07 MED ORDER — ASPIRIN EC 81 MG PO TBEC
81.0000 mg | DELAYED_RELEASE_TABLET | Freq: Every day | ORAL | Status: DC
  Administered 2020-08-07 – 2020-08-09 (×3): 81 mg via ORAL
  Filled 2020-08-07 (×3): qty 1

## 2020-08-07 NOTE — Progress Notes (Signed)
John Hearing, MD paged regarding the pt's c/o constipation and refusal of novolog. CBG 150. Pt concerned novolog will drop blood sugar too low. Education provided.

## 2020-08-07 NOTE — Progress Notes (Signed)
Pharmacy Antibiotic Note  John Nguyen is a 64 y.o. male with hx DM and right great toe wound/infection presented to the ED on 08/06/2020 with worsening of infection. Foot X-ray on 10/25 showed "soft tissue swelling of the first digit without clear evidence of osteomyelitis.  Pharmacy is consulted to dose vancomycin and cefepime for necrotic great toe.  D2 Vanc/Cefepime Afebrile WBC 10.8  SCr improved  Plan:  Continue cefepime 2g IV q8 per current renal function  With improved SCr, change vanc from 1500mg  IV q24 to 1g IV q12 - goal trough 15-20 mcg/mL  _________________________________  Height: 5\' 9"  (175.3 cm) Weight: 83 kg (183 lb) IBW/kg (Calculated) : 70.7  Temp (24hrs), Avg:98.5 F (36.9 C), Min:98.1 F (36.7 C), Max:99.1 F (37.3 C)  Recent Labs  Lab 08/06/20 1410 08/07/20 0327  WBC 11.6* 10.8*  CREATININE 1.26* 1.09  LATICACIDVEN 1.5  --     Estimated Creatinine Clearance: 70.3 mL/min (by C-G formula based on SCr of 1.09 mg/dL).    Allergies  Allergen Reactions  . Cymbalta [Duloxetine Hcl] Rash     Thank you for allowing pharmacy to be a part of this patient's care.  08/08/20 08/07/2020 9:21 AM

## 2020-08-07 NOTE — Progress Notes (Signed)
PROGRESS NOTE    John Nguyen  PJK:[id number] DOB: [birth year] DOA: 08/06/2020 PCP: Pcp, No    Brief Narrative:  Patient admitted to the hospital with the working diagnosis of worsening cellulitis, right foot, failed outpatient antibiotic therapy  64 year old male with past medical history of type 1 diabetes mellitus, he presents with worsening right foot injury for the last 3 weeks, despite oral antibiotic therapy for 10 days.  Lesion predominantly at the distal right foot, first metatarsal.  Positive necrotic changes.  On his initial physical examination blood pressure 157/93, heart rate 96, respiratory rate 16, oxygen saturation 100%.  Moist mucous membranes, lungs clear to auscultation bilaterally, heart S1-S2, present rhythm, soft abdomen, no lower extremity edema.  Significant injury right foot, erythema, edema, first metatarsal necrotic changes. Sodium 134, potassium 3.9, chloride 101, bicarb 20, glucose 150, BUN 12, creatinine 1.26, white count 11.6, hemoglobin 16.1, hematocrit 48.2, platelets 346. SARS COVID-19 negative.  Right foot x-ray with soft tissue swelling of the first digit without clear evidence of osteomyelitis.  Small cortical erosion noted at the head of the fifth metatarsal nonspecific.  Stable indeterminate foreign bodies in the volar aspect of the first and fifth rays.  Patient placed on broad spectrum antibiotic therapy. Unable to perform MRI due to hardware present. Consulted orthopedics, with plan for surgical intervention prior vascular surgery evaluation.   Requested transfer to [facility] for further evaluation.   Assessment & Plan:   Principal Problem:   Cellulitis Active Problems:   Diabetes mellitus with peripheral vascular disease (HCC)   Psoriasis   Depression   Gangrene of toe of right foot (HCC)   PVD (peripheral vascular disease) (HCC)    1.  Right foot, great toe cellulitis/ gangrene.  patient with worsening gangrene on his right great toe.  Continue to have pain, that improved with analgesics. No fever, wbc is 10,8.  Patient had ABI today. Not able to perform MRI due to right foot metal foreign bodies.   Continue with broad-spectrum IV antibiotic with vancomycin and cefepime.  For pain continue with intravenous morphine (increase dose to 2 mg) and on oral ibuprofen and acetaminophen.  Plan to transfer to [facility] for further vascular and orthopedic evaluation and surgical treatment.   2.  Type 1 diabetes mellitus.  fasting glucose this am is 93, will continue with insulin regimen with basal insulin 35 units daily and sliding scale.  Patient is tolerating po well.   3.  Depression.  Stable with no confusion or agitation. .  4.  Psoriasis.  Continue outpatient follow up.   Patient continue to be at high risk for worsening cellulitis and gangrene   Status is: Inpatient  Remains inpatient appropriate because:IV treatments appropriate due to intensity of illness or inability to take PO   Dispo: The patient is from: Home              Anticipated d/c is to: Home              Anticipated d/c date is: > 3 days              Patient currently is not medically stable to d/c.   DVT prophylaxis: Enoxaparin   Code Status:   full  Family Communication:  No family at the bedside       Consultants:   Orthopedics and vascular surgery      Antimicrobials:   Vancomycin and cefepime     Subjective: Patient continue to have pain at the right  foot, toes, with improvement with analgesics, moderate in intensity, with no radiation. Noted necrotic area worsening.   Objective: Vitals:   08/06/20 2148 08/07/20 0239 08/07/20 0637 08/07/20 1323  BP: 123/74 (!) 151/96 (!) 145/73 130/76  Pulse: 84 93 83 76  Resp: 17 17 17    Temp: 98.9 F (37.2 C) 98.1 F (36.7 C) 98.1 F (36.7 C) 98.9 F (37.2 C)  TempSrc:    Oral  SpO2: 97% 100% 98% 98%  Weight:      Height:        Intake/Output Summary (Last 24 hours) at 08/07/2020  1538 Last data filed at 08/07/2020 1400 Gross per 24 hour  Intake 1035.12 ml  Output 1700 ml  Net -664.88 ml   Filed Weights   08/06/20 1333  Weight: 83 kg    Examination:   General: Not in pain or dyspnea, deconditioned  Neurology: Awake and alert, non focal  E ENT: no pallor, no icterus, oral mucosa moist Cardiovascular: No JVD. S1-S2 present, rhythmic, no gallops, rubs, or murmurs. No lower extremity edema. Pulmonary: positive breath sounds bilaterally, Gastrointestinal. Abdomen soft and non tender Skin. Right foot with 1st toe with gangrene and edema, 4th and 5th with ischemic areas. Foot erythema has improved.  Musculoskeletal: no joint deformities     Data Reviewed: I have personally reviewed following labs and imaging studies  CBC: Recent Labs  Lab 08/06/20 1410 08/07/20 0327  WBC 11.6* 10.8*  NEUTROABS 8.0*  --   HGB 16.1 15.9  HCT 48.2 48.4  MCV 91.1 92.7  PLT 346 344   Basic Metabolic Panel: Recent Labs  Lab 08/06/20 1410 08/07/20 0327  NA 134* 138  K 3.9 4.6  CL 101 106  CO2 20* 20*  GLUCOSE 150* 93  BUN 12 9  CREATININE 1.26* 1.09  CALCIUM 9.1 9.1   GFR: Estimated Creatinine Clearance: 70.3 mL/min (by C-G formula based on SCr of 1.09 mg/dL). Liver Function Tests: Recent Labs  Lab 08/06/20 1410  AST 22  ALT 19  ALKPHOS 83  BILITOT 1.1  PROT 6.6  ALBUMIN 3.8   No results for input(s): LIPASE, AMYLASE in the last 168 hours. No results for input(s): AMMONIA in the last 168 hours. Coagulation Profile: No results for input(s): INR, PROTIME in the last 168 hours. Cardiac Enzymes: No results for input(s): CKTOTAL, CKMB, CKMBINDEX, TROPONINI in the last 168 hours. BNP (last 3 results) No results for input(s): PROBNP in the last 8760 hours. HbA1C: Recent Labs    08/06/20 1410  HGBA1C 8.2*   CBG: Recent Labs  Lab 08/06/20 2147 08/06/20 2215 08/06/20 2315 08/07/20 0753 08/07/20 1146  GLUCAP 47* 57* 113* 86 104*   Lipid  Profile: No results for input(s): CHOL, HDL, LDLCALC, TRIG, CHOLHDL, LDLDIRECT in the last 72 hours. Thyroid Function Tests: No results for input(s): TSH, T4TOTAL, FREET4, T3FREE, THYROIDAB in the last 72 hours. Anemia Panel: No results for input(s): VITAMINB12, FOLATE, FERRITIN, TIBC, IRON, RETICCTPCT in the last 72 hours.    Radiology Studies: I have reviewed all of the imaging during this hospital visit personally     Scheduled Meds: . enoxaparin (LOVENOX) injection  40 mg Subcutaneous Q24H  . influenza vac split quadrivalent PF  0.5 mL Intramuscular Tomorrow-1000  . insulin aspart  0-15 Units Subcutaneous TID WC  . insulin NPH Human  35 Units Subcutaneous QAC breakfast  . multivitamin with minerals  1 tablet Oral Daily  . pneumococcal 23 valent vaccine  0.5 mL Intramuscular Tomorrow-1000  Continuous Infusions: . sodium chloride 250 mL (08/07/20 1154)  . ceFEPime (MAXIPIME) IV 2 g (08/07/20 1322)  . vancomycin 1,000 mg (08/07/20 1159)     LOS: 1 day        John Nguyen Annett Gula, MD

## 2020-08-07 NOTE — Progress Notes (Signed)
ABI's have been completed. Preliminary results can be found in CV Proc through chart review.   08/07/20 11:43 AM Olen Cordial RVT

## 2020-08-07 NOTE — Progress Notes (Signed)
Handoff report given to Corlis Hove, RN at [employer].

## 2020-08-07 NOTE — Progress Notes (Signed)
Hypoglycemic Event  CBG: 47 at 2147 on 08/06/20  Treatment: 240 cc of orange juice CBG was rechecked at 2215 and it went up to 57.  Chocolate ice cream was given next.  Brought CBG up to 113 at 2315.   Marikay Alar, FNP from [facility] Floor Coverage was made aware of this patient's CBG at 2221 on 08/06/20 and returned page at 2223 on 08/06/20.   Symptoms: No symptoms.  Possible Reasons for Event: Per Marikay Alar, FNP ([facility] Floor Coverage), patient was possibly given a lot of insulin recently. She is thinking that the dose of 35 units of insulin NPH scheduled for 8 AM on 08/07/20 needs to be adjusted.       Towanda Malkin, RN

## 2020-08-07 NOTE — Progress Notes (Signed)
Unable to obtain MRI of the right foot. Pt has several small metal foreign body's embedded in his foot over area of interest. Pt unable to hold still and metal artifact was too great over great toe per radiologist Dr Sunday Spillers. MRI order cx

## 2020-08-07 NOTE — Progress Notes (Signed)
The patient is alert and oriented and has been seen by his physician. The orders for transfer were written. He is being discharged via [facility] with all of his belongings.

## 2020-08-07 NOTE — Consult Note (Signed)
ORTHOPAEDIC CONSULTATION  REQUESTING PHYSICIAN: Arrien, York Ram,*  Chief Complaint: Painful gangrene right great toe.  HPI: John Nguyen is a 64 y.o. male who presents with a 1 month history of pain in the right great toe patient went to his primary care physician thinking this was gout.  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  Patient denies claudication with walking.  Past Medical History:  Diagnosis Date  . Depression   . Diabetes mellitus without complication (HCC)   . Psoriasis   . Type 1 diabetes mellitus (HCC)    Past Surgical History:  Procedure Laterality Date  . arm fracture surgery Left    Social History   Socioeconomic History  . Marital status: Single    Spouse name: Not on file  . Number of children: Not on file  . Years of education: Not on file  . Highest education level: Not on file  Occupational History  . Not on file  Tobacco Use  . Smoking status: Never Smoker  . Smokeless tobacco: Never Used  Vaping Use  . Vaping Use: Never used  Substance and Sexual Activity  . Alcohol use: Yes    Comment: daily use  . Drug use: Yes    Types: Marijuana  . Sexual activity: Not on file  Other Topics Concern  . Not on file  Social History Narrative  . Not on file   Social Determinants of Health   Financial Resource Strain:   . Difficulty of Paying Living Expenses: Not on file  Food Insecurity:   . Worried About Programme researcher, broadcasting/film/video in the Last Year: Not on file  . Ran Out of Food in the Last Year: Not on file  Transportation Needs:   . Lack of Transportation (Medical): Not on file  . Lack of Transportation (Non-Medical): Not on file  Physical Activity:   . Days of Exercise per Week: Not on file  . Minutes of Exercise per Session: Not on file  Stress:   . Feeling of Stress : Not on file  Social Connections:   . Frequency of Communication with Friends and Family: Not on file  . Frequency of Social  Gatherings with Friends and Family: Not on file  . Attends Religious Services: Not on file  . Active Member of Clubs or Organizations: Not on file  . Attends Banker Meetings: Not on file  . Marital Status: Not on file   Family History  Problem Relation Age of Onset  . Diabetes Mother   . Cancer Mother   . COPD Father    - negative except otherwise stated in the family history section Allergies  Allergen Reactions  . Cymbalta [Duloxetine Hcl] Rash   Prior to Admission medications   Medication Sig Start Date End Date Taking? Authorizing Provider  ciprofloxacin (CIPRO) 500 MG tablet Take 500 mg by mouth 2 (two) times daily. Start date : 1015/21 07/27/20  Yes [provider]  diphenhydrAMINE (BENADRYL) 25 mg capsule Take 25 mg by mouth every 6 (six) hours as needed for itching or allergies.   Yes [provider]  ibuprofen (ADVIL) 200 MG tablet Take 800 mg by mouth 2 (two) times daily as needed for moderate pain.   Yes [provider]  indomethacin (INDOCIN) 25 MG capsule Take 25 mg by mouth 2 (two) times daily with a meal. Start  Date : 07/27/20 07/27/20  Yes [provider]  insulin NPH Human (NOVOLIN N) 100 UNIT/ML injection Inject 35 Units into the skin daily.    Yes [provider]  Multiple Vitamin (MULTIVITAMIN WITH MINERALS) TABS tablet Take 1 tablet by mouth daily.   Yes [provider]  sulfamethoxazole-trimethoprim (BACTRIM DS) 800-160 MG tablet Take 1 tablet by mouth 2 (two) times daily. Start date : 07/27/20 07/27/20  Yes [provider]   DG Foot Complete Right  Result Date: 08/06/2020 CLINICAL DATA:  Cellulitis, first ray necrosis EXAM: RIGHT FOOT COMPLETE - 3+ VIEW COMPARISON:  Radiograph 07/27/2020 FINDINGS: Soft tissue swelling of the first digit without clear evidence of osteomyelitis. Small cortical erosion noted at the head of the fifth metatarsal, nonspecific in can be seen with sequela of  prior infection/osteomyelitis as well as erosive arthropathy. Redemonstrated linear foreign body seen along the plantar aspect at the metatarsal heads and at the plantar aspect of the first interphalangeal joint. Progressive clawtoe deformities of the second through fifth ray. Additional varus deformity of the fifth ray noted, progressive from prior imaging. IMPRESSION: 1. Soft tissue swelling of the first digit without clear evidence of osteomyelitis. 2. Small cortical erosion noted at the head of the fifth metatarsal, nonspecific in can be seen with sequela of prior infection/osteomyelitis as well as erosive arthropathy. 3. Stable indeterminate foreign bodies in the volar aspect of the first and fifth rays. 4. Quintus varus and progressive clawtoe deformities of the second through fifth ray. Electronically Signed   By: Kreg Shropshire M.D.   On: 08/06/2020 15:52   - pertinent xrays, CT, MRI studies were reviewed and independently interpreted  Positive ROS: All other systems have been reviewed and were otherwise negative with the exception of those mentioned in the HPI and as above.  Physical Exam: General: Alert, no acute distress Psychiatric: Patient is competent for consent with normal mood and affect Lymphatic: No axillary or cervical lymphadenopathy Cardiovascular: No pedal edema Respiratory: No cyanosis, no use of accessory musculature GI: No organomegaly, abdomen is soft and non-tender    Images:  @ENCIMAGES @  Labs:  Lab Results  Component Value Date   HGBA1C 8.2 (H) 08/06/2020    Lab Results  Component Value Date   ALBUMIN 3.8 08/06/2020   ALBUMIN 3.8 08/20/2009    Neurologic: Patient does not have protective sensation bilateral lower extremities.   MUSCULOSKELETAL:   Skin: Examination patient has dry black gangrene of the right great toe there is also some ischemic changes to the lesser toes.  Patient does not have a palpable dorsalis pedis or posterior tibial pulse.   He has a strong femoral pulse.  Review of the radiographs calcified arteries within the foot.  No definite osteomyelitis by radiographs.  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  Plan: Plan: I have ordered ankle-brachial indices.  Patient will need evaluation by vascular vein surgery to see if there are reconstructable options for the right lower extremity.  I have consulted Dr. 13/05/2009 and patient will need arteriogram study.  Recommend transfer to [facility].  Thank you for the consult and the opportunity to see Mr. John Neyhart, MD [facility] [phone] 6:44 AM

## 2020-08-07 NOTE — H&P (View-Only) (Signed)
REASON FOR CONSULT:    Peripheral vascular disease with gangrene of the right great toe.  The consult is requested by Dr. [name].  ASSESSMENT & PLAN:   PERIPHERAL VASCULAR DISEASE WITH GANGRENE RIGHT GREAT TOE: This patient has gangrene of the right great toe.  On exam he has evidence of infrainguinal arterial occlusive disease.  Given his peripheral vascular disease and type 1 diabetes this is clearly a limb threatening situation.  His noninvasive studies do not suggest adequate circulation for healing.  He will need an arteriogram although the schedule is quite full at [facility].  Arrangements reportedly are being made for him to be transferred to [facility] and when he is over there we can try to fit him in for an arteriogram possibly this week or early next week.  In the meantime he is on intravenous antibiotics and at this point I do not think this could be treated as an outpatient.  I will follow him once he gets over to [facility].  ID: He is on intravenous vancomycin and Maxipime.  I have started him on aspirin and a statin.  Waverly Ferrari, MD Office: [phone]   HPI:   John Nguyen is a pleasant 64 y.o. male, who was admitted on 08/06/2020 with right foot erythema and edema.  He was noted to have gangrene of the right great toe.  He was evaluated by Dr. Lajoyce Corners and had evidence of peripheral vascular disease.  For this reason vascular surgery was consulted.  On my history the patient works in [employer] and remembers dropping a can on both great toes.  He states that ultimately he lost the nail.  He has had pain in the toe for a month and ultimately the right great toe turned black.  He subsequently developed some cellulitis and was admitted and is on intravenous antibiotics.  Prior to this event he denies any history of claudication or rest pain.  His risk factors for peripheral vascular disease include type 1 diabetes.  He denies any history of hypertension, hypercholesterolemia, family  history of premature cardiovascular disease, or tobacco use.  He denies fever or chills.  Past Medical History:  Diagnosis Date  . Depression   . Diabetes mellitus without complication (HCC)   . Psoriasis   . Type 1 diabetes mellitus (HCC)     Family History  Problem Relation Age of Onset  . Diabetes Mother   . Cancer Mother   . COPD Father     SOCIAL HISTORY: Social History   Socioeconomic History  . Marital status: Single    Spouse name: Not on file  . Number of children: Not on file  . Years of education: Not on file  . Highest education level: Not on file  Occupational History  . Not on file  Tobacco Use  . Smoking status: Never Smoker  . Smokeless tobacco: Never Used  Vaping Use  . Vaping Use: Never used  Substance and Sexual Activity  . Alcohol use: Yes    Comment: daily use  . Drug use: Yes    Types: Marijuana  . Sexual activity: Not on file  Other Topics Concern  . Not on file  Social History Narrative  . Not on file   Social Determinants of Health   Financial Resource Strain:   . Difficulty of Paying Living Expenses: Not on file  Food Insecurity:   . Worried About Programme researcher, broadcasting/film/video in the Last Year: Not on file  . Ran Out  of Food in the Last Year: Not on file  Transportation Needs:   . Lack of Transportation (Medical): Not on file  . Lack of Transportation (Non-Medical): Not on file  Physical Activity:   . Days of Exercise per Week: Not on file  . Minutes of Exercise per Session: Not on file  Stress:   . Feeling of Stress : Not on file  Social Connections:   . Frequency of Communication with Friends and Family: Not on file  . Frequency of Social Gatherings with Friends and Family: Not on file  . Attends Religious Services: Not on file  . Active Member of Clubs or Organizations: Not on file  . Attends Club or Organization Meetings: Not on file  . Marital Status: Not on file  Intimate Partner Violence:   . Fear of Current or Ex-Partner:  Not on file  . Emotionally Abused: Not on file  . Physically Abused: Not on file  . Sexually Abused: Not on file    Allergies  Allergen Reactions  . Cymbalta [Duloxetine Hcl] Rash    Current Facility-Administered Medications  Medication Dose Route Frequency Provider Last Rate Last Admin  . 0.9 %  sodium chloride infusion   Intravenous PRN Arrien, Mauricio Daniel, MD   Stopped at 08/07/20 1626  . acetaminophen (TYLENOL) tablet 650 mg  650 mg Oral Q6H PRN Arrien, Mauricio Daniel, MD   650 mg at 08/07/20 1248   Or  . acetaminophen (TYLENOL) suppository 650 mg  650 mg Rectal Q6H PRN Arrien, Mauricio Daniel, MD      . ceFEPIme (MAXIPIME) 2 g in sodium chloride 0.9 % 100 mL IVPB  2 g Intravenous Q8H Pham, Anh P, RPH 200 mL/hr at 08/07/20 1322 2 g at 08/07/20 1322  . diphenhydrAMINE (BENADRYL) capsule 25 mg  25 mg Oral Q6H PRN Arrien, Mauricio Daniel, MD      . enoxaparin (LOVENOX) injection 40 mg  40 mg Subcutaneous Q24H Arrien, Mauricio Daniel, MD   40 mg at 08/06/20 2221  . ibuprofen (ADVIL) tablet 400 mg  400 mg Oral Q4H PRN Arrien, Mauricio Daniel, MD   400 mg at 08/07/20 1630  . influenza vac split quadrivalent PF (FLUARIX) injection 0.5 mL  0.5 mL Intramuscular Tomorrow-1000 Arrien, Mauricio Daniel, MD      . insulin aspart (novoLOG) injection 0-15 Units  0-15 Units Subcutaneous TID WC Arrien, Mauricio Daniel, MD      . insulin NPH Human (NOVOLIN N) injection 35 Units  35 Units Subcutaneous QAC breakfast Arrien, Mauricio Daniel, MD   35 Units at 08/07/20 1337  . morphine 2 MG/ML injection 2 mg  2 mg Intravenous Q2H PRN Arrien, Mauricio Daniel, MD      . multivitamin with minerals tablet 1 tablet  1 tablet Oral Daily Arrien, Mauricio Daniel, MD   1 tablet at 08/07/20 0931  . ondansetron (ZOFRAN) tablet 4 mg  4 mg Oral Q6H PRN Arrien, Mauricio Daniel, MD       Or  . ondansetron (ZOFRAN) injection 4 mg  4 mg Intravenous Q6H PRN Arrien, Mauricio Daniel, MD      . pneumococcal 23 valent  vaccine (PNEUMOVAX-23) injection 0.5 mL  0.5 mL Intramuscular Tomorrow-1000 Arrien, Mauricio Daniel, MD      . polyethylene glycol (MIRALAX / GLYCOLAX) packet 17 g  17 g Oral BID Arrien, Mauricio Daniel, MD      . vancomycin (VANCOCIN) IVPB 1000 mg/200 mL premix  1,000 mg Intravenous Q12H Legge, Justin M, RPH 200 mL/hr   at 08/07/20 1159 1,000 mg at 08/07/20 1159    REVIEW OF SYSTEMS: 2 [X]  denotes positive finding, [ ]  denotes negative finding Cardiac  Comments:  Chest pain or chest pressure:    Shortness of breath upon exertion:    Short of breath when lying flat:    Irregular heart rhythm:        Vascular    Pain in calf, thigh, or hip brought on by ambulation:    Pain in feet at night that wakes you up from your sleep:     Blood clot in your veins:    Leg swelling:  x       Pulmonary    Oxygen at home:    Productive cough:     Wheezing:         Neurologic    Sudden weakness in arms or legs:     Sudden numbness in arms or legs:     Sudden onset of difficulty speaking or slurred speech:    Temporary loss of vision in one eye:     Problems with dizziness:         Gastrointestinal    Blood in stool:     Vomited blood:         Genitourinary    Burning when urinating:     Blood in urine:        Psychiatric    Major depression:         Hematologic    Bleeding problems:    Problems with blood clotting too easily:        Skin    Rashes or ulcers: x       Constitutional    Fever or chills:     PHYSICAL EXAM:   Vitals:   08/06/20 2148 08/07/20 0239 08/07/20 0637 08/07/20 1323  BP: 123/74 (!) 151/96 (!) 145/73 130/76  Pulse: 84 93 83 76  Resp: 17 17 17    Temp: 98.9 F (37.2 C) 98.1 F (36.7 C) 98.1 F (36.7 C) 98.9 F (37.2 C)  TempSrc:    Oral  SpO2: 97% 100% 98% 98%  Weight:      Height:        GENERAL: The patient is a well-nourished male, in no acute distress. The vital signs are documented above. CARDIAC: There is a regular rate and rhythm.    VASCULAR: I do not detect carotid bruits. On the right side he has a palpable femoral pulse and barely palpable popliteal pulse.  I cannot palpate pedal pulses.  He has a monophasic posterior tibial and lateral tarsal signal with a Doppler. On the left side, he has a palpable femoral and popliteal pulse.  He has a brisk biphasic posterior tibial signal with the Doppler.  He has a monophasic dorsalis pedis and peroneal signal. PULMONARY: There is good air exchange bilaterally without wheezing or rales. ABDOMEN: Soft and non-tender with normal pitched bowel sounds.  MUSCULOSKELETAL: There are no major deformities or cyanosis. NEUROLOGIC: No focal weakness or paresthesias are detected. SKIN: He has dry gangrene of the right great toe. He also has a superficial ulceration on his right third toe.  He has a wound between the fourth and fifth toes also.      PSYCHIATRIC: The patient has a normal affect.  DATA:    ARTERIAL DOPPLER STUDY: I have independently interpreted his arterial Doppler study today.  On the right side he has a biphasic posterior tibial signal with a monophasic dorsalis pedis signal.  ABI 71% although I think this is likely falsely elevated secondary to calcific disease.  Toe pressure was not done.  Of note I was unable to reproduce the biphasic posterior tibial signal on the right on my exam.  On the left side, he has a biphasic posterior tibial signal and a monophasic dorsalis pedis signal.  ABIs 96%.  Again I think this is falsely elevated secondary to calcific disease.  Toe pressure on the left is 30 mmHg.  LABS: I reviewed his labs today.  His GFR is greater than 60.  White count 10.8.  Hemoglobin 15.9.  Platelets 344,000.  X-RAY OF THE RIGHT FOOT: There is soft tissue swelling of the first digit without clear evidence of osteomyelitis.

## 2020-08-07 NOTE — Consult Note (Signed)
REASON FOR CONSULT:    Peripheral vascular disease with gangrene of the right great toe.  The consult is requested by Dr. [name].  ASSESSMENT & PLAN:   PERIPHERAL VASCULAR DISEASE WITH GANGRENE RIGHT GREAT TOE: This patient has gangrene of the right great toe.  On exam he has evidence of infrainguinal arterial occlusive disease.  Given his peripheral vascular disease and type 1 diabetes this is clearly a limb threatening situation.  His noninvasive studies do not suggest adequate circulation for healing.  He will need an arteriogram although the schedule is quite full at [facility].  Arrangements reportedly are being made for him to be transferred to [facility] and when he is over there we can try to fit him in for an arteriogram possibly this week or early next week.  In the meantime he is on intravenous antibiotics and at this point I do not think this could be treated as an outpatient.  I will follow him once he gets over to [facility].  ID: He is on intravenous vancomycin and Maxipime.  I have started him on aspirin and a statin.  Waverly Ferrari, MD Office: [phone]   HPI:   John Nguyen is a pleasant 64 y.o. male, who was admitted on 08/06/2020 with right foot erythema and edema.  He was noted to have gangrene of the right great toe.  He was evaluated by Dr. Lajoyce Corners and had evidence of peripheral vascular disease.  For this reason vascular surgery was consulted.  On my history the patient works in [employer] and remembers dropping a can on both great toes.  He states that ultimately he lost the nail.  He has had pain in the toe for a month and ultimately the right great toe turned black.  He subsequently developed some cellulitis and was admitted and is on intravenous antibiotics.  Prior to this event he denies any history of claudication or rest pain.  His risk factors for peripheral vascular disease include type 1 diabetes.  He denies any history of hypertension, hypercholesterolemia, family  history of premature cardiovascular disease, or tobacco use.  He denies fever or chills.  Past Medical History:  Diagnosis Date  . Depression   . Diabetes mellitus without complication (HCC)   . Psoriasis   . Type 1 diabetes mellitus (HCC)     Family History  Problem Relation Age of Onset  . Diabetes Mother   . Cancer Mother   . COPD Father     SOCIAL HISTORY: Social History   Socioeconomic History  . Marital status: Single    Spouse name: Not on file  . Number of children: Not on file  . Years of education: Not on file  . Highest education level: Not on file  Occupational History  . Not on file  Tobacco Use  . Smoking status: Never Smoker  . Smokeless tobacco: Never Used  Vaping Use  . Vaping Use: Never used  Substance and Sexual Activity  . Alcohol use: Yes    Comment: daily use  . Drug use: Yes    Types: Marijuana  . Sexual activity: Not on file  Other Topics Concern  . Not on file  Social History Narrative  . Not on file   Social Determinants of Health   Financial Resource Strain:   . Difficulty of Paying Living Expenses: Not on file  Food Insecurity:   . Worried About Programme researcher, broadcasting/film/video in the Last Year: Not on file  . Ran Out  of Food in the Last Year: Not on file  Transportation Needs:   . Lack of Transportation (Medical): Not on file  . Lack of Transportation (Non-Medical): Not on file  Physical Activity:   . Days of Exercise per Week: Not on file  . Minutes of Exercise per Session: Not on file  Stress:   . Feeling of Stress : Not on file  Social Connections:   . Frequency of Communication with Friends and Family: Not on file  . Frequency of Social Gatherings with Friends and Family: Not on file  . Attends Religious Services: Not on file  . Active Member of Clubs or Organizations: Not on file  . Attends BankerClub or Organization Meetings: Not on file  . Marital Status: Not on file  Intimate Partner Violence:   . Fear of Current or Ex-Partner:  Not on file  . Emotionally Abused: Not on file  . Physically Abused: Not on file  . Sexually Abused: Not on file    Allergies  Allergen Reactions  . Cymbalta [Duloxetine Hcl] Rash    Current Facility-Administered Medications  Medication Dose Route Frequency Provider Last Rate Last Admin  . 0.9 %  sodium chloride infusion   Intravenous PRN Arrien, York RamMauricio Daniel, MD   Stopped at 08/07/20 1626  . acetaminophen (TYLENOL) tablet 650 mg  650 mg Oral Q6H PRN Arrien, York RamMauricio Daniel, MD   650 mg at 08/07/20 1248   Or  . acetaminophen (TYLENOL) suppository 650 mg  650 mg Rectal Q6H PRN Arrien, York RamMauricio Daniel, MD      . ceFEPIme (MAXIPIME) 2 g in sodium chloride 0.9 % 100 mL IVPB  2 g Intravenous Q8H Pham, Anh P, RPH 200 mL/hr at 08/07/20 1322 2 g at 08/07/20 1322  . diphenhydrAMINE (BENADRYL) capsule 25 mg  25 mg Oral Q6H PRN Arrien, York RamMauricio Daniel, MD      . enoxaparin (LOVENOX) injection 40 mg  40 mg Subcutaneous Q24H Coralie KeensArrien, Mauricio Daniel, MD   40 mg at 08/06/20 2221  . ibuprofen (ADVIL) tablet 400 mg  400 mg Oral Q4H PRN Arrien, York RamMauricio Daniel, MD   400 mg at 08/07/20 1630  . influenza vac split quadrivalent PF (FLUARIX) injection 0.5 mL  0.5 mL Intramuscular Tomorrow-1000 Arrien, York RamMauricio Daniel, MD      . insulin aspart (novoLOG) injection 0-15 Units  0-15 Units Subcutaneous TID WC Arrien, York RamMauricio Daniel, MD      . insulin NPH Human (NOVOLIN N) injection 35 Units  35 Units Subcutaneous QAC breakfast Coralie KeensArrien, Mauricio Daniel, MD   35 Units at 08/07/20 1337  . morphine 2 MG/ML injection 2 mg  2 mg Intravenous Q2H PRN Arrien, York RamMauricio Daniel, MD      . multivitamin with minerals tablet 1 tablet  1 tablet Oral Daily Arrien, York RamMauricio Daniel, MD   1 tablet at 08/07/20 0931  . ondansetron (ZOFRAN) tablet 4 mg  4 mg Oral Q6H PRN Arrien, York RamMauricio Daniel, MD       Or  . ondansetron [facility](ZOFRAN) injection 4 mg  4 mg Intravenous Q6H PRN Arrien, York RamMauricio Daniel, MD      . pneumococcal 23 valent  vaccine (PNEUMOVAX-23) injection 0.5 mL  0.5 mL Intramuscular Tomorrow-1000 Arrien, York RamMauricio Daniel, MD      . polyethylene glycol (MIRALAX / Ethelene HalGLYCOLAX) packet 17 g  17 g Oral BID Arrien, York RamMauricio Daniel, MD      . vancomycin (VANCOCIN) IVPB 1000 mg/200 mL premix  1,000 mg Intravenous Q12H Earl ManyLegge, Justin M, RPH 200 mL/hr  at 08/07/20 1159 1,000 mg at 08/07/20 1159    REVIEW OF SYSTEMS: 2 [X]  denotes positive finding, [ ]  denotes negative finding Cardiac  Comments:  Chest pain or chest pressure:    Shortness of breath upon exertion:    Short of breath when lying flat:    Irregular heart rhythm:        Vascular    Pain in calf, thigh, or hip brought on by ambulation:    Pain in feet at night that wakes you up from your sleep:     Blood clot in your veins:    Leg swelling:  x       Pulmonary    Oxygen at home:    Productive cough:     Wheezing:         Neurologic    Sudden weakness in arms or legs:     Sudden numbness in arms or legs:     Sudden onset of difficulty speaking or slurred speech:    Temporary loss of vision in one eye:     Problems with dizziness:         Gastrointestinal    Blood in stool:     Vomited blood:         Genitourinary    Burning when urinating:     Blood in urine:        Psychiatric    Major depression:         Hematologic    Bleeding problems:    Problems with blood clotting too easily:        Skin    Rashes or ulcers: x       Constitutional    Fever or chills:     PHYSICAL EXAM:   Vitals:   08/06/20 2148 08/07/20 0239 08/07/20 0637 08/07/20 1323  BP: 123/74 (!) 151/96 (!) 145/73 130/76  Pulse: 84 93 83 76  Resp: 17 17 17    Temp: 98.9 F (37.2 C) 98.1 F (36.7 C) 98.1 F (36.7 C) 98.9 F (37.2 C)  TempSrc:    Oral  SpO2: 97% 100% 98% 98%  Weight:      Height:        GENERAL: The patient is a well-nourished male, in no acute distress. The vital signs are documented above. CARDIAC: There is a regular rate and rhythm.    VASCULAR: I do not detect carotid bruits. On the right side he has a palpable femoral pulse and barely palpable popliteal pulse.  I cannot palpate pedal pulses.  He has a monophasic posterior tibial and lateral tarsal signal with a Doppler. On the left side, he has a palpable femoral and popliteal pulse.  He has a brisk biphasic posterior tibial signal with the Doppler.  He has a monophasic dorsalis pedis and peroneal signal. PULMONARY: There is good air exchange bilaterally without wheezing or rales. ABDOMEN: Soft and non-tender with normal pitched bowel sounds.  MUSCULOSKELETAL: There are no major deformities or cyanosis. NEUROLOGIC: No focal weakness or paresthesias are detected. SKIN: He has dry gangrene of the right great toe. He also has a superficial ulceration on his right third toe.  He has a wound between the fourth and fifth toes also.      PSYCHIATRIC: The patient has a normal affect.  DATA:    ARTERIAL DOPPLER STUDY: I have independently interpreted his arterial Doppler study today.  On the right side he has a biphasic posterior tibial signal with a monophasic dorsalis pedis signal.  ABI 71% although I think this is likely falsely elevated secondary to calcific disease.  Toe pressure was not done.  Of note I was unable to reproduce the biphasic posterior tibial signal on the right on my exam.  On the left side, he has a biphasic posterior tibial signal and a monophasic dorsalis pedis signal.  ABIs 96%.  Again I think this is falsely elevated secondary to calcific disease.  Toe pressure on the left is 30 mmHg.  LABS: I reviewed his labs today.  His GFR is greater than 60.  White count 10.8.  Hemoglobin 15.9.  Platelets 344,000.  X-RAY OF THE RIGHT FOOT: There is soft tissue swelling of the first digit without clear evidence of osteomyelitis.

## 2020-08-08 ENCOUNTER — Encounter (HOSPITAL_COMMUNITY): Payer: Self-pay | Admitting: Surgery

## 2020-08-08 ENCOUNTER — Encounter (HOSPITAL_COMMUNITY)
Admission: EM | Disposition: A | Discharge status: Home / Self Care | Payer: Self-pay | Source: Home / Self Care | Attending: Internal Medicine

## 2020-08-08 HISTORY — PX: PERIPHERAL VASCULAR ATHERECTOMY: CATH118256

## 2020-08-08 HISTORY — PX: ABDOMINAL AORTOGRAM W/LOWER EXTREMITY: CATH118223

## 2020-08-08 HISTORY — PX: PERIPHERAL VASCULAR INTERVENTION: CATH118257

## 2020-08-08 LAB — GLUCOSE, CAPILLARY
Glucose-Capillary: 64 mg/dL — ABNORMAL LOW (ref 70–99)
Glucose-Capillary: 112 mg/dL — ABNORMAL HIGH (ref 70–99)
Glucose-Capillary: 150 mg/dL — ABNORMAL HIGH (ref 70–99)
Glucose-Capillary: 69 mg/dL — ABNORMAL LOW (ref 70–99)
Glucose-Capillary: 127 mg/dL — ABNORMAL HIGH (ref 70–99)
Glucose-Capillary: 160 mg/dL — ABNORMAL HIGH (ref 70–99)
Glucose-Capillary: 152 mg/dL — ABNORMAL HIGH (ref 70–99)

## 2020-08-08 LAB — POCT ACTIVATED CLOTTING TIME
Activated Clotting Time: 230 seconds
Activated Clotting Time: 230 seconds

## 2020-08-08 SURGERY — ABDOMINAL AORTOGRAM W/LOWER EXTREMITY
Anesthesia: LOCAL

## 2020-08-08 MED ORDER — SODIUM CHLORIDE 0.9 % IV SOLN: 250.0000 mL | INTRAVENOUS | Status: DC | PRN

## 2020-08-08 MED ORDER — SODIUM CHLORIDE 0.9 % WEIGHT BASED INFUSION
1.0000 mL/kg/h | INTRAVENOUS | Status: AC
  Administered 2020-08-08: 1 mL/kg/h via INTRAVENOUS

## 2020-08-08 MED ORDER — CLOPIDOGREL BISULFATE 300 MG PO TABS
ORAL_TABLET | ORAL | Status: DC | PRN
  Administered 2020-08-08: 300 mg via ORAL

## 2020-08-08 MED ORDER — CLOPIDOGREL BISULFATE 75 MG PO TABS
75.0000 mg | ORAL_TABLET | Freq: Every day | ORAL | Status: DC
  Administered 2020-08-09: 75 mg via ORAL
  Filled 2020-08-08: qty 1

## 2020-08-08 MED ORDER — HEPARIN (PORCINE) IN NACL 1000-0.9 UT/500ML-% IV SOLN
INTRAVENOUS | Status: DC | PRN
  Administered 2020-08-08 (×2): 500 mL

## 2020-08-08 MED ORDER — MIDAZOLAM HCL 2 MG/2ML IJ SOLN
INTRAMUSCULAR | Status: AC
  Filled 2020-08-08: qty 2

## 2020-08-08 MED ORDER — SODIUM CHLORIDE 0.9% FLUSH
3.0000 mL | Freq: Two times a day (BID) | INTRAVENOUS | Status: DC
  Administered 2020-08-08: 3 mL via INTRAVENOUS

## 2020-08-08 MED ORDER — FENTANYL CITRATE (PF) 100 MCG/2ML IJ SOLN
INTRAMUSCULAR | Status: AC
  Filled 2020-08-08: qty 2

## 2020-08-08 MED ORDER — ACETAMINOPHEN 325 MG PO TABS
650.0000 mg | ORAL_TABLET | ORAL | Status: DC | PRN
  Administered 2020-08-08 – 2020-08-09 (×3): 650 mg via ORAL
  Filled 2020-08-08 (×4): qty 2

## 2020-08-08 MED ORDER — VIPERSLIDE LUBRICANT OPTIME: TOPICAL | Status: DC | PRN

## 2020-08-08 MED ORDER — HEPARIN SODIUM (PORCINE) 1000 UNIT/ML IJ SOLN
INTRAMUSCULAR | Status: AC
  Filled 2020-08-08: qty 1

## 2020-08-08 MED ORDER — ONDANSETRON HCL 4 MG/2ML IJ SOLN: 4.0000 mg | Freq: Four times a day (QID) | INTRAMUSCULAR | Status: DC | PRN

## 2020-08-08 MED ORDER — LIDOCAINE HCL (PF) 1 % IJ SOLN
INTRAMUSCULAR | Status: DC | PRN
  Administered 2020-08-08: 20 mL

## 2020-08-08 MED ORDER — LABETALOL HCL 5 MG/ML IV SOLN: 10.0000 mg | INTRAVENOUS | Status: DC | PRN

## 2020-08-08 MED ORDER — DEXTROSE 50 % IV SOLN
INTRAVENOUS | Status: DC | PRN
  Administered 2020-08-08: 25 mL via INTRAVENOUS

## 2020-08-08 MED ORDER — LIDOCAINE HCL (PF) 1 % IJ SOLN
INTRAMUSCULAR | Status: AC
  Filled 2020-08-08: qty 30

## 2020-08-08 MED ORDER — NITROGLYCERIN IN D5W 200-5 MCG/ML-% IV SOLN
INTRAVENOUS | Status: AC
  Filled 2020-08-08: qty 250

## 2020-08-08 MED ORDER — SODIUM CHLORIDE 0.9% FLUSH: 3.0000 mL | INTRAVENOUS | Status: DC | PRN

## 2020-08-08 MED ORDER — INSULIN NPH (HUMAN) (ISOPHANE) 100 UNIT/ML ~~LOC~~ SUSP
28.0000 [IU] | Freq: Every day | SUBCUTANEOUS | Status: DC
  Administered 2020-08-08: 28 [IU] via SUBCUTANEOUS

## 2020-08-08 MED ORDER — SODIUM CHLORIDE 0.9 % IV SOLN: INTRAVENOUS | Status: DC

## 2020-08-08 MED ORDER — DEXTROSE 50 % IV SOLN
INTRAVENOUS | Status: AC
  Filled 2020-08-08: qty 50

## 2020-08-08 MED ORDER — FENTANYL CITRATE (PF) 100 MCG/2ML IJ SOLN
INTRAMUSCULAR | Status: DC | PRN
Start: 2020-08-08 — End: 2020-08-08
  Administered 2020-08-08 (×3): 25 ug via INTRAVENOUS
  Administered 2020-08-08: 50 ug via INTRAVENOUS

## 2020-08-08 MED ORDER — VERAPAMIL HCL 2.5 MG/ML IV SOLN
INTRAVENOUS | Status: AC
  Filled 2020-08-08: qty 2

## 2020-08-08 MED ORDER — VANCOMYCIN HCL IN DEXTROSE 1-5 GM/200ML-% IV SOLN
1000.0000 mg | Freq: Two times a day (BID) | INTRAVENOUS | Status: DC
  Administered 2020-08-08 – 2020-08-09 (×2): 1000 mg via INTRAVENOUS
  Filled 2020-08-08 (×3): qty 200

## 2020-08-08 MED ORDER — IODIXANOL 320 MG/ML IV SOLN
INTRAVENOUS | Status: DC | PRN
  Administered 2020-08-08: 160 mL via INTRA_ARTERIAL

## 2020-08-08 MED ORDER — CLOPIDOGREL BISULFATE 300 MG PO TABS
ORAL_TABLET | ORAL | Status: AC
  Filled 2020-08-08: qty 1

## 2020-08-08 MED ORDER — INSULIN NPH (HUMAN) (ISOPHANE) 100 UNIT/ML ~~LOC~~ SUSP
35.0000 [IU] | Freq: Every evening | SUBCUTANEOUS | Status: DC
  Filled 2020-08-08: qty 10

## 2020-08-08 MED ORDER — MIDAZOLAM HCL 2 MG/2ML IJ SOLN
INTRAMUSCULAR | Status: DC | PRN
  Administered 2020-08-08 (×3): 1 mg via INTRAVENOUS
  Administered 2020-08-08: 2 mg via INTRAVENOUS

## 2020-08-08 MED ORDER — HYDRALAZINE HCL 20 MG/ML IJ SOLN: 5.0000 mg | INTRAMUSCULAR | Status: DC | PRN

## 2020-08-08 MED ORDER — HEPARIN (PORCINE) IN NACL 1000-0.9 UT/500ML-% IV SOLN
INTRAVENOUS | Status: AC
  Filled 2020-08-08: qty 1000

## 2020-08-08 MED ORDER — CLOPIDOGREL BISULFATE 75 MG PO TABS
300.0000 mg | ORAL_TABLET | Freq: Once | ORAL | Status: AC
  Administered 2020-08-08: 300 mg via ORAL

## 2020-08-08 MED ORDER — HEPARIN SODIUM (PORCINE) 1000 UNIT/ML IJ SOLN
INTRAMUSCULAR | Status: DC | PRN
  Administered 2020-08-08: 2000 [IU] via INTRAVENOUS
  Administered 2020-08-08: 8000 [IU] via INTRAVENOUS

## 2020-08-08 SURGICAL SUPPLY — 28 items
BALLN STERLING OTW 3X40X150 (BALLOONS) ×3
BALLOON STERLING OTW 3X40X150 (BALLOONS) IMPLANT
CATH OMNI FLUSH 5F 65CM (CATHETERS) ×1 IMPLANT
CATH QUICKCROSS .035X135CM (MICROCATHETER) ×1 IMPLANT
CLOSURE MYNX CONTROL 6F/7F (Vascular Products) ×1 IMPLANT
DCB RANGER 5.0X80 135 (BALLOONS) IMPLANT
DEVICE TORQUE H2O (MISCELLANEOUS) ×1 IMPLANT
DIAMONDBACK CLASSIC OAS 1.5MM (CATHETERS) ×3
GUIDEWIRE ANGLED .035X260CM (WIRE) ×1 IMPLANT
KIT ENCORE 26 ADVANTAGE (KITS) ×1 IMPLANT
KIT MICROPUNCTURE NIT STIFF (SHEATH) ×1 IMPLANT
KIT PV (KITS) ×3 IMPLANT
LUBRICANT VIPERSLIDE CORONARY (MISCELLANEOUS) ×1 IMPLANT
RANGER DCB 5.0X80 135 (BALLOONS) ×3
SHEATH PINNACLE 5F 10CM (SHEATH) ×1 IMPLANT
SHEATH PINNACLE 7F 10CM (SHEATH) ×1 IMPLANT
SHEATH PINNACLE MP 7F 45CM (SHEATH) ×1 IMPLANT
SHEATH PROBE COVER 6X72 (BAG) ×1 IMPLANT
SHIELD RADPAD SCOOP 12X17 (MISCELLANEOUS) ×1 IMPLANT
STENT INNOVA 6X100X130 (Permanent Stent) ×1 IMPLANT
SYR MEDRAD MARK 7 150ML (SYRINGE) ×3 IMPLANT
SYSTEM DIMNDBCK CLSC OAS 1.5MM (CATHETERS) IMPLANT
TAPE VIPERTRACK RADIOPAQ (MISCELLANEOUS) IMPLANT
TAPE VIPERTRACK RADIOPAQUE (MISCELLANEOUS) ×3
TRANSDUCER W/STOPCOCK (MISCELLANEOUS) ×3 IMPLANT
TRAY PV CATH (CUSTOM PROCEDURE TRAY) ×3 IMPLANT
WIRE BENTSON .035X145CM (WIRE) ×1 IMPLANT
WIRE VIPER ADVANCE .017X335CM (WIRE) ×1 IMPLANT

## 2020-08-08 NOTE — Progress Notes (Signed)
Patient back from cath lab at this time. Left groin mynx dressing clean dry and intact. Level 0. V/s and assessment complete. Will continue to monitor.

## 2020-08-08 NOTE — Plan of Care (Signed)
Continue to monitor

## 2020-08-08 NOTE — Op Note (Signed)
Patient name: John Nguyen MRN: [id number] DOB: Oct 27, 1955 Sex: male  08/08/2020 Pre-operative Diagnosis: Right toe ulcer Post-operative diagnosis:  Same Surgeon:  Durene Cal Procedure Performed:  1.  Ultrasound-guided access, left femoral artery  2.  Abdominal aortogram  3.  Bilateral lower extremity runoff  4.  Orbital atherectomy, right popliteal artery  5.  Orbital atherectomy, right tibioperoneal trunk and peroneal artery  6.  Stent, right popliteal artery  7.  Angioplasty, right tibioperoneal trunk and peroneal artery  8.  Conscious sedation, 89 minutes  9.  Closure device, Mynx   Indications: This is a 64 year old gentleman with type 1 diabetes and gangrenous right great toe who comes in today for arterial evaluation and possible intervention  Procedure:  The patient was identified in the holding area and taken to room 8.  The patient was then placed supine on the table and prepped and draped in the usual sterile fashion.  A time out was called.  Conscious sedation was administered with the use of IV fentanyl and Versed under continuous physician and nurse monitoring.  Heart rate, blood pressure, and oxygen saturation were continuously monitored.  Total sedation time was 89 minutes.  Ultrasound was used to evaluate the left common femoral artery.  It was patent .  A digital ultrasound image was acquired.  A micropuncture needle was used to access the left common femoral artery under ultrasound guidance.  An 018 wire was advanced without resistance and a micropuncture sheath was placed.  The 018 wire was removed and a benson wire was placed.  The micropuncture sheath was exchanged for a 5 french sheath.  An omniflush catheter was advanced over the wire to the level of L-1.  An abdominal angiogram was obtained.  Next, the Omni Flush catheter was pulled down to the bifurcation and bilateral runoff was performed. Findings:   Aortogram: No significant renal artery stenosis.  The  infrarenal abdominal aorta is widely patent.  Bilateral common and external iliac arteries are widely patent.  Right Lower Extremity: The right common femoral profundofemoral and superficial femoral artery are widely patent.  There is a segment of the above-knee popliteal artery with diffuse disease with stenosis greater than 70%.  The below-knee popliteal artery is widely patent.  There is diffuse tibial disease.  The anterior tibial and posterior tibial artery are occluded in the mid leg.  There is a near occlusive lesion of the distal tibioperoneal trunk going into the peroneal artery which is patent however very small in caliber.  Minimal opacification of the arteries across the ankle  Left Lower Extremity: The left common femoral and profundofemoral artery are widely patent.  Superficial femoral artery and popliteal artery are widely patent.  There is severe tibial disease with occlusion of all 3 vessels and reconstitution of the posterior tibial artery at the ankle.  Intervention: After the above images were obtained the decision made to proceed with intervention.  A 7 French 45 cm sheath was advanced into the right common femoral artery.  The patient was fully heparinized.  Next using a 3 5 Glidewire and a quick cross catheter, the superficial femoral artery was selected and the wire was navigated across the lesion in the popliteal artery.  Similarly the Glidewire was advanced across the lesion in the tibioperoneal trunk.  The quick cross catheter was then placed into the peroneal artery.  A Viper wire was then inserted.  Next, a CSI classic device was used to perform orbital atherectomy in the popliteal artery.  Down the back passes were made at low medium and high speeds.  Orbital atherectomy was then performed across the tibioperoneal trunk and peroneal lesion going down and back at low and medium speeds.  Next, a 3 x 40 Sterling balloon was used to perform balloon angioplasty of the tibioperoneal  trunk and peroneal artery.  I then performed drug-coated balloon angioplasty of the popliteal stenosis using a 5 x 80 Ranger balloon.  Completion imaging was then performed.  This showed a widely patent tibioperoneal trunk intervention.  There is still residual stenosis within the popliteal artery.  I therefore elected to stent the popliteal artery with a 6 x 100 INNOVA stent which was postdilated with a 5 mm balloon.  Completion imaging showed resolution of the stenosis of the treated areas.  There continues to be poor opacification of muscles across the ankle.  A minx device was used for closure  Impression:  #1  Significant stenosis within the above-knee popliteal artery treated using orbital atherectomy with a 1.5 CSI device followed by drug-coated balloon angioplasty with residual stenosis requiring stenting using a 6 x 100.  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 Durene Cal, M.D., [facility] [place] Office: [phone] Pager:  [phone]

## 2020-08-08 NOTE — Progress Notes (Signed)
Patient took home dose of insulin, NPH 28 units at this time unwitnessed by RN. Dr. Lucianne Muss aware. Patient instructed NOT to take any home meds without ok and consent from MD. Patient verbalizes understanding. Will continue to monitor.

## 2020-08-08 NOTE — Progress Notes (Signed)
Patient to cath lab at this time

## 2020-08-08 NOTE — Progress Notes (Signed)
PROGRESS NOTE    John Nguyen  ZOX:[id number] DOB: 12-14-1955 DOA: 08/06/2020 PCP: Pcp, No    Brief Narrative: Patient admitted to the hospital with the working diagnosis of worsening cellulitis, right foot, failed outpatient antibiotic therapy.  This 64 year old male with past medical history of type 1 diabetes mellitus, He presents with worsening right foot cellulitis for the last 3 weeks, despite oral antibiotic therapy for 10 days. Lesion predominantly at the distal right foot, first metatarsal. Positive necrotic changes. Right foot x-ray with soft tissue swelling of the first digit without clear evidence of osteomyelitis.  Patient placed on broad spectrum antibiotic therapy. Unable to perform MRI due to hardware present. Consulted orthopedics, with plan for surgical intervention prior vascular surgery evaluation.   Patient being transferred to [facility] for vascular surgery and Ortho evaluation.  Patient underwent abdominal aortogram and stenting.  Tolerated well.  Assessment & Plan:   Principal Problem:   Cellulitis Active Problems:   Diabetes mellitus with peripheral vascular disease (HCC)   Psoriasis   Depression   Gangrene of toe of right foot (HCC)   PVD (peripheral vascular disease) (HCC)   1.Right foot, great toe cellulitis/ gangrene.    Patient with worsening gangrene on his right great toe.   Continue to have pain, that improved with analgesics.   No fever, wbc is 10,8.  Patient had ABI today.   Not able to perform MRI due to right foot metal foreign bodies.   Continue with broad-spectrum IV antibiotic with vancomycin and cefepime.   For pain continue with intravenous morphine (increase dose to 2 mg) and on oral ibuprofenandacetaminophen.  Patient underwent abdominal aortogram and stenting 10/27.  Tolerated well.  Further plan would be as per vascular and orthopedics.   2.Type 1 diabetes mellitus.  Fasting glucose this am is 93, will continue with  insulin regimen with basal insulin35 units dailyand sliding scale. Patient is tolerating po well.   3.Depression.Stable with no confusion or agitation.   4.Psoriasis.Continue outpatient follow up.   Patient continue to be at high risk for worsening cellulitis and gangrene.    DVT prophylaxis: Lovenox Code Status: Full  Family Communication: No one at bed side. Disposition Plan:   Status is: Inpatient  Remains inpatient appropriate because:Inpatient level of care appropriate due to severity of illness   Dispo: The patient is from: Home              Anticipated d/c is to: Home              Anticipated d/c date is: > 3 days              Patient currently is not medically stable to d/c.   Consultants:   Vascular surgery  Orthopaedics.  Procedures:   Antimicrobials:  Anti-infectives (From admission, onward)   Start     Dose/Rate Route Frequency Ordered Stop   08/07/20 1600  vancomycin (VANCOREADY) IVPB 1500 mg/300 mL  Status:  Discontinued        1,500 mg 150 mL/hr over 120 Minutes Intravenous Every 24 hours 08/06/20 1747 08/07/20 0923   08/07/20 1200  vancomycin (VANCOCIN) IVPB 1000 mg/200 mL premix        1,000 mg 200 mL/hr over 60 Minutes Intravenous Every 12 hours 08/07/20 0923     08/06/20 2100  ceFEPIme (MAXIPIME) 2 g in sodium chloride 0.9 % 100 mL IVPB        2 g 200 mL/hr over 30 Minutes Intravenous Every 8 hours  08/06/20 1748     08/06/20 1815  vancomycin (VANCOCIN) IVPB 1000 mg/200 mL premix  Status:  Discontinued        1,000 mg 200 mL/hr over 60 Minutes Intravenous  Once 08/06/20 1729 08/06/20 1749   08/06/20 1815  ceFEPIme (MAXIPIME) 2 g in sodium chloride 0.9 % 100 mL IVPB  Status:  Discontinued        2 g 200 mL/hr over 30 Minutes Intravenous  Once 08/06/20 1729 08/06/20 1749   08/06/20 1445  vancomycin (VANCOREADY) IVPB 1750 mg/350 mL        1,750 mg 175 mL/hr over 120 Minutes Intravenous  Once 08/06/20 1435 08/06/20 1744   08/06/20  1415  piperacillin-tazobactam (ZOSYN) IVPB 3.375 g        3.375 g 100 mL/hr over 30 Minutes Intravenous  Once 08/06/20 1413 08/06/20 1547      Subjective: Patient seen and examined at bedside.  Overnight events noted.  Patient underwent abdominal aortogram with stenting tolerated well.  He has gangrenous right great toe.  Objective: Vitals:   08/08/20 1245 08/08/20 1300 08/08/20 1330 08/08/20 1400  BP: 101/62 116/68 135/89 129/69  Pulse: 80 82 88 90  Resp: 14 15 13 14   Temp:      TempSrc:      SpO2:      Weight:      Height:        Intake/Output Summary (Last 24 hours) at 08/08/2020 1433 Last data filed at 08/08/2020 0450 Gross per 24 hour  Intake 1094.27 ml  Output 2250 ml  Net -1155.73 ml   Filed Weights   08/06/20 1333 08/07/20 2320  Weight: 83 kg 81.9 kg    Examination:  General exam: Appears calm and comfortable  Respiratory system: Clear to auscultation. Respiratory effort normal. Cardiovascular system: S1 & S2 heard, RRR. No JVD, murmurs, rubs, gallops or clicks. No pedal edema. Gastrointestinal system: Abdomen is nondistended, soft and nontender. No organomegaly or masses felt. Normal bowel sounds heard. Central nervous system: Alert and oriented. No focal neurological deficits. Extremities: Right foot with 1st toe with gangrene and edema, 4th and 5th with ischemic areas Skin: No rashes, lesions or ulcers Psychiatry: Judgement and insight appear normal. Mood & affect appropriate.     Data Reviewed: I have personally reviewed following labs and imaging studies  CBC: Recent Labs  Lab 08/06/20 1410 08/07/20 0327  WBC 11.6* 10.8*  NEUTROABS 8.0*  --   HGB 16.1 15.9  HCT 48.2 48.4  MCV 91.1 92.7  PLT 346 344   Basic Metabolic Panel: Recent Labs  Lab 08/06/20 1410 08/07/20 0327  NA 134* 138  K 3.9 4.6  CL 101 106  CO2 20* 20*  GLUCOSE 150* 93  BUN 12 9  CREATININE 1.26* 1.09  CALCIUM 9.1 9.1   GFR: Estimated Creatinine Clearance: 70.3  mL/min (by C-G formula based on SCr of 1.09 mg/dL). Liver Function Tests: Recent Labs  Lab 08/06/20 1410  AST 22  ALT 19  ALKPHOS 83  BILITOT 1.1  PROT 6.6  ALBUMIN 3.8   No results for input(s): LIPASE, AMYLASE in the last 168 hours. No results for input(s): AMMONIA in the last 168 hours. Coagulation Profile: No results for input(s): INR, PROTIME in the last 168 hours. Cardiac Enzymes: No results for input(s): CKTOTAL, CKMB, CKMBINDEX, TROPONINI in the last 168 hours. BNP (last 3 results) No results for input(s): PROBNP in the last 8760 hours. HbA1C: Recent Labs    08/06/20 1410  HGBA1C 8.2*   CBG: Recent Labs  Lab 08/08/20 0648 08/08/20 0758 08/08/20 1133 08/08/20 1145 08/08/20 1231  GLUCAP 64* 112* 69* 150* 127*   Lipid Profile: No results for input(s): CHOL, HDL, LDLCALC, TRIG, CHOLHDL, LDLDIRECT in the last 72 hours. Thyroid Function Tests: No results for input(s): TSH, T4TOTAL, FREET4, T3FREE, THYROIDAB in the last 72 hours. Anemia Panel: No results for input(s): VITAMINB12, FOLATE, FERRITIN, TIBC, IRON, RETICCTPCT in the last 72 hours. Sepsis Labs: Recent Labs  Lab 08/06/20 1410  LATICACIDVEN 1.5    Recent Results (from the past 240 hour(s))  Respiratory Panel by RT PCR (Flu A&B, Covid) - Nasal Mucosa     Status: None   Collection Time: 08/06/20  2:11 PM   Specimen: Nasal Mucosa; Nasopharyngeal  Result Value Ref Range Status   SARS Coronavirus 2 by RT PCR NEGATIVE NEGATIVE Final    Comment: (NOTE) SARS-CoV-2 target nucleic acids are NOT DETECTED.  The SARS-CoV-2 RNA is generally detectable in upper respiratoy specimens during the acute phase of infection. The lowest concentration of SARS-CoV-2 viral copies this assay can detect is 131 copies/mL. A negative result does not preclude SARS-Cov-2 infection and should not be used as the sole basis for treatment or other patient management decisions. A negative result may occur with  improper specimen  collection/handling, submission of specimen other than nasopharyngeal swab, presence of viral mutation(s) within the areas targeted by this assay, and inadequate number of viral copies (<131 copies/mL). A negative result must be combined with clinical observations, patient history, and epidemiological information. The expected result is Negative.  Fact Sheet for Patients:  https://www.moore.com/  Fact Sheet for Healthcare Providers:  https://www.young.biz/  This test is no t yet approved or cleared by the [place] FDA and  has been authorized for detection and/or diagnosis of SARS-CoV-2 by FDA under an Emergency Use Authorization (EUA). This EUA will remain  in effect (meaning this test can be used) for the duration of the COVID-19 declaration under Section 564(b)(1) of the Act, 21 U.S.C. section 360bbb-3(b)(1), unless the authorization is terminated or revoked sooner.     Influenza A by PCR NEGATIVE NEGATIVE Final   Influenza B by PCR NEGATIVE NEGATIVE Final    Comment: (NOTE) The Xpert Xpress SARS-CoV-2/FLU/RSV assay is intended as an aid in  the diagnosis of influenza from Nasopharyngeal swab specimens and  should not be used as a sole basis for treatment. Nasal washings and  aspirates are unacceptable for Xpert Xpress SARS-CoV-2/FLU/RSV  testing.  Fact Sheet for Patients: https://www.moore.com/  Fact Sheet for Healthcare Providers: https://www.young.biz/  This test is not yet approved or cleared by the [place] FDA and  has been authorized for detection and/or diagnosis of SARS-CoV-2 by  FDA under an Emergency Use Authorization (EUA). This EUA will remain  in effect (meaning this test can be used) for the duration of the  Covid-19 declaration under Section 564(b)(1) of the Act, 21  U.S.C. section 360bbb-3(b)(1), unless the authorization is  terminated or revoked. Performed at [facility], [address], [place], [place] [address]   MRSA PCR Screening     Status: None   Collection Time: 08/06/20  2:11 PM   Specimen: Nasal Mucosa; Nasopharyngeal  Result Value Ref Range Status   MRSA by PCR NEGATIVE NEGATIVE Final    Comment:        The GeneXpert MRSA Assay (FDA approved for NASAL specimens only), is one component of a comprehensive MRSA colonization surveillance program.  It is not intended to diagnose MRSA infection nor to guide or monitor treatment for MRSA infections. Performed at [facility], [address], [place], [place] [address]   Culture, blood (routine x 2)     Status: None (Preliminary result)   Collection Time: 08/06/20  3:00 PM   Specimen: BLOOD  Result Value Ref Range Status   Specimen Description   Final    BLOOD LEFT ANTECUBITAL Performed at [facility], [address] [place], [place] [address]    Special Requests   Final    BOTTLES DRAWN AEROBIC AND ANAEROBIC Blood Culture results may not be optimal due to an inadequate volume of blood received in culture bottles Performed at [facility], [address] [place], [place] [address]    Culture   Final    NO GROWTH 2 DAYS Performed at [facility], [address], [place], [place] [address]    Report Status PENDING  Incomplete  Culture, blood (routine x 2)     Status: None (Preliminary result)   Collection Time: 08/06/20  3:00 PM   Specimen: BLOOD  Result Value Ref Range Status   Specimen Description   Final    BLOOD RIGHT ANTECUBITAL Performed at [facility], [address] [place], [place] [address]    Special Requests   Final    BOTTLES DRAWN AEROBIC AND ANAEROBIC Blood Culture adequate volume Performed at [facility], [address], [place], [place] [address]    Culture   Final    NO GROWTH 2 DAYS Performed at [facility], [address], [place], [place] [address]    Report Status PENDING  Incomplete         Radiology Studies: PERIPHERAL VASCULAR CATHETERIZATION  Result Date: 08/08/2020 Patient name: Dannel Rafter MRN: [id number] DOB: 04-Jan-1956 Sex: male 08/08/2020 Pre-operative Diagnosis: Right toe ulcer Post-operative diagnosis:  Same Surgeon:  Durene Cal Procedure Performed:  1.  Ultrasound-guided access, left femoral artery  2.  Abdominal aortogram  3.  Bilateral lower extremity runoff  4.  Orbital atherectomy, right popliteal artery  5.  Orbital atherectomy, right tibioperoneal trunk and peroneal artery  6.  Stent, right popliteal artery  7.  Angioplasty, right tibioperoneal trunk and peroneal artery  8.  Conscious sedation, 89 minutes  9.  Closure device, Mynx Indications: This is a 64 year old gentleman with type 1 diabetes and gangrenous right great toe who comes in today for arterial evaluation and possible intervention Procedure:  The patient was identified in the holding area and taken to room 8.  The patient was then placed supine on the table and prepped and draped in the usual sterile fashion.  A time out was called.  Conscious sedation was administered with the use of IV fentanyl and Versed under continuous physician and nurse monitoring.  Heart rate, blood pressure, and oxygen saturation were continuously monitored.  Total sedation time was 89 minutes.  Ultrasound was used to evaluate the left common femoral artery.  It was patent .  A digital ultrasound image was acquired.  A micropuncture needle was used to access the left common femoral artery under ultrasound guidance.  An 018 wire was advanced without resistance and a micropuncture sheath was placed.  The 018 wire was removed and a benson wire was placed.  The micropuncture sheath was exchanged for a 5 french sheath.  An omniflush catheter was advanced over the wire to the level  of L-1.  An abdominal angiogram was obtained.  Next, the Omni Flush catheter was pulled  down to the bifurcation and bilateral runoff was performed. Findings:  Aortogram: No significant renal artery stenosis.  The infrarenal abdominal aorta is widely patent.  Bilateral common and external iliac arteries are widely patent.  Right Lower Extremity: The right common femoral profundofemoral and superficial femoral artery are widely patent.  There is a segment of the above-knee popliteal artery with diffuse disease with stenosis greater than 70%.  The below-knee popliteal artery is widely patent.  There is diffuse tibial disease.  The anterior tibial and posterior tibial artery are occluded in the mid leg.  There is a near occlusive lesion of the distal tibioperoneal trunk going into the peroneal artery which is patent however very small in caliber.  Minimal opacification of the arteries across the ankle  Left Lower Extremity: The left common femoral and profundofemoral artery are widely patent.  Superficial femoral artery and popliteal artery are widely patent.  There is severe tibial disease with occlusion of all 3 vessels and reconstitution of the posterior tibial artery at the ankle. Intervention: After the above images were obtained the decision made to proceed with intervention.  A 7 French 45 cm sheath was advanced into the right common femoral artery.  The patient was fully heparinized.  Next using a 3 5 Glidewire and a quick cross catheter, the superficial femoral artery was selected and the wire was navigated across the lesion in the popliteal artery.  Similarly the Glidewire was advanced across the lesion in the tibioperoneal trunk.  The quick cross catheter was then placed into the peroneal artery.  A Viper wire was then inserted.  Next, a CSI classic device was used to perform orbital atherectomy in the popliteal artery.  Down the back passes were made at low medium and high speeds.  Orbital atherectomy was then performed across the tibioperoneal trunk and peroneal lesion going down and back at  low and medium speeds.  Next, a 3 x 40 Sterling balloon was used to perform balloon angioplasty of the tibioperoneal trunk and peroneal artery.  I then performed drug-coated balloon angioplasty of the popliteal stenosis using a 5 x 80 Ranger balloon.  Completion imaging was then performed.  This showed a widely patent tibioperoneal trunk intervention.  There is still residual stenosis within the popliteal artery.  I therefore elected to stent the popliteal artery with a 6 x 100 INNOVA stent which was postdilated with a 5 mm balloon.  Completion imaging showed resolution of the stenosis of the treated areas.  There continues to be poor opacification of muscles across the ankle.  A minx device was used for closure Impression:  #1  Significant stenosis within the above-knee popliteal artery treated using orbital atherectomy with a 1.5 CSI device followed by drug-coated balloon angioplasty with residual stenosis requiring stenting using a 6 x 100.  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 Durene Cal, M.D., [facility] [place] Office: [phone] Pager:  [phone]  DG Foot Complete Right  Result Date: 08/06/2020 CLINICAL DATA:  Cellulitis, first ray necrosis EXAM: RIGHT FOOT COMPLETE - 3+ VIEW COMPARISON:  Radiograph 07/27/2020 FINDINGS: Soft tissue swelling of the first digit  without clear evidence of osteomyelitis. Small cortical erosion noted at the head of the fifth metatarsal, nonspecific in can be seen with sequela of prior infection/osteomyelitis as well as erosive arthropathy. Redemonstrated linear foreign body seen along the plantar aspect at the metatarsal heads and at the plantar aspect of the first interphalangeal joint. Progressive  clawtoe deformities of the second through fifth ray. Additional varus deformity of the fifth ray noted, progressive from prior imaging. IMPRESSION: 1. Soft tissue swelling of the first digit without clear evidence of osteomyelitis. 2. Small cortical erosion noted at the head of the fifth metatarsal, nonspecific in can be seen with sequela of prior infection/osteomyelitis as well as erosive arthropathy. 3. Stable indeterminate foreign bodies in the volar aspect of the first and fifth rays. 4. Quintus varus and progressive clawtoe deformities of the second through fifth ray. Electronically Signed   By: Kreg Shropshire M.D.   On: 08/06/2020 15:52   VAS [place] ABI WITH/WO TBI  Result Date: 08/07/2020 LOWER EXTREMITY DOPPLER STUDY Indications: Gangrene. High Risk Factors: Diabetes.  Limitations: Today's exam was limited due to patient positioning, an open wound              and patient movement. Comparison Study: No prior studies. Performing Technologist: Olen Cordial RVT  Examination Guidelines: A complete evaluation includes at minimum, Doppler waveform signals and systolic blood pressure reading at the level of bilateral brachial, anterior tibial, and posterior tibial arteries, when vessel segments are accessible. Bilateral testing is considered an integral part of a complete examination. Photoelectric Plethysmograph (PPG) waveforms and toe systolic pressure readings are included as required and additional duplex testing as needed. Limited examinations for reoccurring indications may be performed as noted.  ABI Findings: +--------+------------------+-----+----------+--------+ Right   Rt Pressure (mmHg)IndexWaveform  Comment  +--------+------------------+-----+----------+--------+ [id number]                    triphasic          +--------+------------------+-----+----------+--------+ PTA     129               0.71 biphasic           +--------+------------------+-----+----------+--------+ DP      91                 0.50 monophasic         +--------+------------------+-----+----------+--------+ +---------+------------------+-----+----------+-------+ Left     Lt Pressure (mmHg)IndexWaveform  Comment +---------+------------------+-----+----------+-------+ Brachial 143                    triphasic         +---------+------------------+-----+----------+-------+ PTA      174               0.96 biphasic          +---------+------------------+-----+----------+-------+ DP       74                0.41 monophasic        +---------+------------------+-----+----------+-------+ Great Toe30                0.16                   +---------+------------------+-----+----------+-------+ +-------+-----------+-----------+------------+------------+ ABI/TBIToday's ABIToday's TBIPrevious ABIPrevious TBI +-------+-----------+-----------+------------+------------+ Right  0.71                                           +-------+-----------+-----------+------------+------------+  Left   0.96       0.16                                +-------+-----------+-----------+------------+------------+  Summary: Right: Resting right ankle-brachial index indicates moderate right lower extremity arterial disease. Values are likely unreliable due to medial calcification. Left: Resting left ankle-brachial index is within normal range. No evidence of significant left lower extremity arterial disease. The left toe-brachial index is abnormal. Values are likely unreliable due to medial calcification.  *See table(s) above for measurements and observations.  Electronically signed by Waverly Ferrarihristopher Dickson MD on 08/07/2020 at 5:39:08 PM.    Final         Scheduled Meds: . aspirin EC  81 mg Oral Daily  . atorvastatin  20 mg Oral Daily  . [START ON 08/09/2020] clopidogrel  75 mg Oral Q breakfast  . enoxaparin (LOVENOX) injection  40 mg Subcutaneous Q24H  . influenza vac split quadrivalent PF  0.5 mL  Intramuscular Tomorrow-1000  . insulin aspart  0-15 Units Subcutaneous TID WC  . insulin NPH Human  28 Units Subcutaneous Q supper  . multivitamin with minerals  1 tablet Oral Daily  . pneumococcal 23 valent vaccine  0.5 mL Intramuscular Tomorrow-1000  . polyethylene glycol  17 g Oral BID  . sodium chloride flush  3 mL Intravenous Q12H   Continuous Infusions: . sodium chloride 500 mL (08/08/20 0015)  . sodium chloride 999 mL/hr at 08/08/20 1119  . sodium chloride    . sodium chloride 1 mL/kg/hr (08/08/20 1356)  . ceFEPime (MAXIPIME) IV 2 g (08/08/20 1404)  . vancomycin 1,000 mg (08/08/20 0018)     LOS: 2 days    Time spent: 35 mins    Rollyn Scialdone, MD [facility]   If 7PM-7AM, please contact night-coverage

## 2020-08-08 NOTE — Interval H&P Note (Signed)
History and Physical Interval Note:  08/08/2020 10:14 AM  John Nguyen  has presented today for surgery, with the diagnosis of claudication.  The various methods of treatment have been discussed with the patient and family. After consideration of risks, benefits and other options for treatment, the patient has consented to  Procedure(s): ABDOMINAL AORTOGRAM W/LOWER EXTREMITY (N/A) as a surgical intervention.  The patient's history has been reviewed, patient examined, no change in status, stable for surgery.  I have reviewed the patient's chart and labs.  Questions were answered to the patient's satisfaction.     Durene Cal

## 2020-08-08 NOTE — Progress Notes (Addendum)
Pt transferred from [facility] to 4E01. He's alert and oriented x 4. Hemodynamics stable. NSR on monitor. He complained having moderate pain on his right great toe which presents with black gangrene, no drainage.  Pt tolerated pain well after Morphine and Tylenol given. He's able to ambulate in his room with standby assist.  CHG bath given, room oriented, call bell within each, CCMD called with 2nd verified. Pt refused to change his cloth.  Pt refused to get insulin from our hospital. He preferred to manage his blood glucose with his Novolin N from home. He concerned about his blood glucose used to be fluctuated and had severe hypoglycemic from Novolog before. His CBG was 85 mg/dl at 93:26 prior transferring to [facility]. He expected to discuss about his insulin management with MD at am. No acute distress noted at arrival. We will continue to monitor.  Filiberto Pinks, RN

## 2020-08-08 NOTE — Progress Notes (Addendum)
Inpatient Diabetes Program Recommendations  AACE/ADA: New Consensus Statement on Inpatient Glycemic Control (2015)  Target Ranges:  Prepandial:   less than 140 mg/dL      Peak postprandial:   less than 180 mg/dL (1-2 hours)      Critically ill patients:  140 - 180 mg/dL   Lab Results  Component Value Date   GLUCAP 112 (H) 08/08/2020   HGBA1C 8.2 (H) 08/06/2020    Review of Glycemic Control Results for John Nguyen, John Nguyen (MRN [id number]) as of 08/08/2020 10:13  Ref. Range 08/07/2020 11:46 08/07/2020 16:22 08/07/2020 21:14 08/08/2020 06:48 08/08/2020 07:58  Glucose-Capillary Latest Ref Range: 70 - 99 mg/dL 544 (H) 920 (H) 85 64 (L) 112 (H)   Diabetes history:  DM1 Outpatient Diabetes medications:  NPH 35 units daily Current orders for Inpatient glycemic control:  NPH 35 units with breakfast Novolog 0-15 units tid  Inpatient Diabetes Program Recommendations:    NPH 28 units with breakfast (80% of home dose)   Addendum @ 1402: Spoke with patient at bedside about DM regimen.  He confirms he has type 1 DM.  He takes above home NPH 35 daily.  He states he has regular as well and he takes that when his blood sugar is very elevated.  He does not have a PCP or insurance.  He has been managing his diabetes with this regimen for years.  Used to have lows weekly when he was on basal bolus.  States he has only had 1 or 2 severe lows this year.  He checks his blood sugar multiple times a day.  He lives by himself and rents a room in a house.  Encouraged him to establish with a PCP.  Placed a consult for TOC for [facility].  He states he has applied for Medicaid.  Patient states he would consider establishing with them.   Encouraged a CGM if Medicaid accepts him.  Will continue to follow while inpatient.  Thank you, Dulce Sellar, RN, BSN Diabetes Coordinator Inpatient Diabetes Program [phone] (team pager from 8a-5p)

## 2020-08-08 NOTE — Progress Notes (Signed)
Inpatient Diabetes Program Recommendations  AACE/ADA: New Consensus Statement on Inpatient Glycemic Control (2015)  Target Ranges:  Prepandial:   less than 140 mg/dL      Peak postprandial:   less than 180 mg/dL (1-2 hours)      Critically ill patients:  140 - 180 mg/dL   Lab Results  Component Value Date   GLUCAP 127 (H) 08/08/2020   HGBA1C 8.2 (H) 08/06/2020  late entry  Review of Glycemic Control  Diabetes history: DM1 Outpatient Diabetes medications: NPH 35 units daily Current orders for Inpatient glycemic control:NPH 35 units  Inpatient Diabetes Program Recommendations:   Spoke with patient @ length @ bedside regarding diabetes management and NPH once daily is not typical coverage for a patient with type 1 diabetes. Patient states he has been on different insulin regimens in the past including Lantus + Novolog regimen but patient states did not work as well. Patient states he sometimes has a low but not very often. Reviewed hypoglycemia protocol and patient verbalized correct management of hypoglycemia. Patient very open for discussion but does not want to change anything about his regimen. Patient states he currently does not have a PCP and manages his diabetes himself. Encouraged patient to followup with a PCP.  Thank you, John Nguyen. Irving Bloor, RN, MSN, CDE  Diabetes Coordinator Inpatient Glycemic Control Team Team Pager [phone] (8am-5pm) 08/08/2020 2:37 PM

## 2020-08-09 ENCOUNTER — Other Ambulatory Visit (HOSPITAL_COMMUNITY): Payer: Self-pay | Admitting: Internal Medicine

## 2020-08-09 LAB — BASIC METABOLIC PANEL
Anion gap: 6 (ref 5–15)
BUN: 9 mg/dL (ref 8–23)
CO2: 21 mmol/L — ABNORMAL LOW (ref 22–32)
Calcium: 8.5 mg/dL — ABNORMAL LOW (ref 8.9–10.3)
Chloride: 109 mmol/L (ref 98–111)
Creatinine, Ser: 0.91 mg/dL (ref 0.61–1.24)
GFR, Estimated: >60 mL/min (ref >60)
Glucose, Bld: 94 mg/dL (ref 70–99)
Potassium: 3.9 mmol/L (ref 3.5–5.1)
Sodium: 136 mmol/L (ref 135–145)

## 2020-08-09 LAB — CBC
HCT: 44.1 % (ref 39.0–52.0)
Hemoglobin: 14.3 g/dL (ref 13.0–17.0)
MCH: 29.7 pg (ref 26.0–34.0)
MCHC: 32.4 g/dL (ref 30.0–36.0)
MCV: 91.7 fL (ref 80.0–100.0)
Platelets: 309 10*3/uL (ref 150–400)
RBC: 4.81 MIL/uL (ref 4.22–5.81)
RDW: 12.7 % (ref 11.5–15.5)
WBC: 11.5 10*3/uL — ABNORMAL HIGH (ref 4.0–10.5)
nRBC: 0 % (ref 0.0–0.2)

## 2020-08-09 LAB — GLUCOSE, CAPILLARY
Glucose-Capillary: 31 mg/dL — CL (ref 70–99)
Glucose-Capillary: 165 mg/dL — ABNORMAL HIGH (ref 70–99)
Glucose-Capillary: 126 mg/dL — ABNORMAL HIGH (ref 70–99)
Glucose-Capillary: 207 mg/dL — ABNORMAL HIGH (ref 70–99)
Glucose-Capillary: 206 mg/dL — ABNORMAL HIGH (ref 70–99)

## 2020-08-09 LAB — MAGNESIUM: Magnesium: 2 mg/dL (ref 1.7–2.4)

## 2020-08-09 LAB — PHOSPHORUS: Phosphorus: 3.5 mg/dL (ref 2.5–4.6)

## 2020-08-09 MED ORDER — PNEUMOCOCCAL VAC POLYVALENT 25 MCG/0.5ML IJ INJ: 0.5000 mL | INJECTION | INTRAMUSCULAR | Status: DC

## 2020-08-09 MED ORDER — PNEUMOCOCCAL VAC POLYVALENT 25 MCG/0.5ML IJ INJ: 0.5000 mL | INJECTION | Freq: Once | INTRAMUSCULAR | Status: DC

## 2020-08-09 MED ORDER — CLOPIDOGREL BISULFATE 75 MG PO TABS
75.0000 mg | ORAL_TABLET | Freq: Every day | ORAL | 3 refills | Status: DC
Start: 2020-08-10 — End: 2020-08-10

## 2020-08-09 MED ORDER — INFLUENZA VAC A&B SA ADJ QUAD 0.5 ML IM PRSY: 0.5000 mL | PREFILLED_SYRINGE | Freq: Once | INTRAMUSCULAR | Status: DC

## 2020-08-09 MED ORDER — ASPIRIN 81 MG PO TBEC: 81.0000 mg | DELAYED_RELEASE_TABLET | Freq: Every day | ORAL | 11 refills | Status: AC

## 2020-08-09 MED ORDER — ATORVASTATIN CALCIUM 80 MG PO TABS: 80.0000 mg | ORAL_TABLET | Freq: Every day | ORAL | 0 refills | Status: DC

## 2020-08-09 MED ORDER — ACETAMINOPHEN 325 MG PO TABS
650.0000 mg | ORAL_TABLET | ORAL | Status: DC | PRN
Start: 2020-08-09 — End: 2020-08-09

## 2020-08-09 MED ORDER — DEXTROSE 50 % IV SOLN
25.0000 g | INTRAVENOUS | Status: AC
  Administered 2020-08-09: 25 g via INTRAVENOUS

## 2020-08-09 MED ORDER — DEXTROSE 50 % IV SOLN
INTRAVENOUS | Status: AC
  Filled 2020-08-09: qty 50

## 2020-08-09 MED ORDER — HYDROCODONE-ACETAMINOPHEN 5-325 MG PO TABS: 1.0000 | ORAL_TABLET | Freq: Four times a day (QID) | ORAL | 0 refills | Status: AC | PRN

## 2020-08-09 MED ORDER — INSULIN NPH (HUMAN) (ISOPHANE) 100 UNIT/ML ~~LOC~~ SUSP: 12.0000 [IU] | Freq: Two times a day (BID) | SUBCUTANEOUS | Status: DC

## 2020-08-09 MED ORDER — CLOPIDOGREL BISULFATE 75 MG PO TABS
75.0000 mg | ORAL_TABLET | Freq: Every day | ORAL | 3 refills | Status: DC
Start: 2020-08-10 — End: 2020-08-09

## 2020-08-09 MED ORDER — ATORVASTATIN CALCIUM 80 MG PO TABS: 80.0000 mg | ORAL_TABLET | Freq: Every day | ORAL | Status: DC

## 2020-08-09 MED ORDER — INSULIN NPH (HUMAN) (ISOPHANE) 100 UNIT/ML ~~LOC~~ SUSP: 12.0000 [IU] | Freq: Two times a day (BID) | SUBCUTANEOUS | 11 refills | Status: AC

## 2020-08-09 MED FILL — CLOPIDOGREL 75 MG TABLET: 75 | 30 days supply | Qty: 30 | Fill #0

## 2020-08-09 NOTE — Progress Notes (Signed)
Subjective  - POD #1, status post angiography  No complaints this morning   Physical Exam:  Groin site is without complication Brisk peroneal Doppler signal Persisting gangrenous changes to the right great toe      Assessment/Plan:  POD #1  Patient remains at high risk for amputation given the foot disease seen on angiography yesterday.  His intervention from yesterday remains widely patent.  He is stable for discharge from vascular perspective.  He will need to be on dual antiplatelet therapy with aspirin and Plavix.  He is following up with Dr. Sharol Given next week for wound evaluation.  He will see [place] back in the next month for follow-up of his stent placement.  John Nguyen 08/09/2020 10:01 AM --  Vitals:   08/09/20 0500 08/09/20 0744  BP:  (!) 156/74  Pulse: 95 99  Resp: 16 18  Temp:  97.8 F (36.6 C)  SpO2: 96% 100%    Intake/Output Summary (Last 24 hours) at 08/09/2020 1001 Last data filed at 08/09/2020 0941 Gross per 24 hour  Intake 3291.26 ml  Output 1000 ml  Net 2291.26 ml     Laboratory CBC    Component Value Date/Time   WBC 11.5 (H) 08/09/2020 0055   HGB 14.3 08/09/2020 0055   HCT 44.1 08/09/2020 0055   PLT 309 08/09/2020 0055    BMET    Component Value Date/Time   NA 136 08/09/2020 0055   K 3.9 08/09/2020 0055   CL 109 08/09/2020 0055   CO2 21 (L) 08/09/2020 0055   GLUCOSE 94 08/09/2020 0055   BUN 9 08/09/2020 0055   CREATININE 0.91 08/09/2020 0055   CALCIUM 8.5 (L) 08/09/2020 0055   GFRNONAA >60 08/09/2020 0055   GFRAA  08/20/2009 2325    >60        The eGFR has been calculated using the MDRD equation. This calculation has not been validated in all clinical situations. eGFR's persistently <60 mL/min signify possible Chronic Kidney Disease.    COAG No results found for: INR, PROTIME No results found for: PTT  Antibiotics Anti-infectives (From admission, onward)   Start     Dose/Rate Route Frequency Ordered Stop    08/08/20 1830  vancomycin (VANCOCIN) IVPB 1000 mg/200 mL premix        1,000 mg 200 mL/hr over 60 Minutes Intravenous Every 12 hours 08/08/20 1743     08/07/20 1600  vancomycin (VANCOREADY) IVPB 1500 mg/300 mL  Status:  Discontinued        1,500 mg 150 mL/hr over 120 Minutes Intravenous Every 24 hours 08/06/20 1747 08/07/20 0923   08/07/20 1200  vancomycin (VANCOCIN) IVPB 1000 mg/200 mL premix  Status:  Discontinued        1,000 mg 200 mL/hr over 60 Minutes Intravenous Every 12 hours 08/07/20 0923 08/08/20 1743   08/06/20 2100  ceFEPIme (MAXIPIME) 2 g in sodium chloride 0.9 % 100 mL IVPB        2 g 200 mL/hr over 30 Minutes Intravenous Every 8 hours 08/06/20 1748     08/06/20 1815  vancomycin (VANCOCIN) IVPB 1000 mg/200 mL premix  Status:  Discontinued        1,000 mg 200 mL/hr over 60 Minutes Intravenous  Once 08/06/20 1729 08/06/20 1749   08/06/20 1815  ceFEPIme (MAXIPIME) 2 g in sodium chloride 0.9 % 100 mL IVPB  Status:  Discontinued        2 g 200 mL/hr over 30 Minutes Intravenous  Once 08/06/20  1729 08/06/20 1749   08/06/20 1445  vancomycin (VANCOREADY) IVPB 1750 mg/350 mL        1,750 mg 175 mL/hr over 120 Minutes Intravenous  Once 08/06/20 1435 08/06/20 1744   08/06/20 1415  piperacillin-tazobactam (ZOSYN) IVPB 3.375 g        3.375 g 100 mL/hr over 30 Minutes Intravenous  Once 08/06/20 1413 08/06/20 1547       V. Leia Alf, M.D., [facility] [place] Office: [phone] Pager:  [phone]

## 2020-08-09 NOTE — Progress Notes (Signed)
Mobility Specialist - Progress Note   08/09/20 1618  Mobility  Activity Ambulated in hall  Level of Assistance Independent  Assistive Device None  Distance Ambulated (ft) 490 ft  Mobility Response Tolerated well  Mobility performed by Mobility specialist  $Mobility charge 1 Mobility    Pre-mobility: 98 HR During mobility: 109 HR Post-mobility: 98 HR  Pt asx throughout ambulation. He c/o a burning sensation in his R foot that he rated a 4/10 after ambulation. RN notified.   Mamie Levers Mobility Specialist Mobility Specialist Phone: [phone]

## 2020-08-09 NOTE — Progress Notes (Signed)
Patient ID: John Nguyen, male   DOB: [birth year], 64 y.o.   MRN: [id number] Patient is status post revascularization of the right lower extremity with excellent inflow at this time.  He does have dry gangrenous changes to the right great toe I will follow-up as an outpatient and see how this delineates itself and plan for toe amputation accordingly.  There is dry gangrenous changes with no signs of infection.

## 2020-08-09 NOTE — Progress Notes (Signed)
At 0423 am. Pt appeared confused, hallucinated, diaphoresis, HR 110. Checked CBG 31 mg/dl. Hypoglycemic standing order initiated. 50% dextrose 25 mg given IV.  Rechecked CBG later came up to 165 mg/dl. Jasmine December, chart nurse notified. Pt refused self insulin administration since yesterday. Last Insulin self administration around 5 pm per Starr,RN day shift reported. Pt was immediately alert and awake after 50% Dextrose given. Hemodynamics stable.  Will continue to monitor.  Results for ILARIO, DHALIWAL (MRN [id number]) as of 08/09/2020 05:09  Ref. Range 08/08/2020 17:00 08/08/2020 21:18 08/09/2020 00:55 08/09/2020 04:23 08/09/2020 04:52  Glucose-Capillary Latest Ref Range: 70 - 99 mg/dL 979 (H) 480 (H)  31 (LL) 165 (H)    Filiberto Pinks, RN

## 2020-08-09 NOTE — Progress Notes (Signed)
Inpatient Diabetes Program Recommendations  AACE/ADA: New Consensus Statement on Inpatient Glycemic Control (2015)  Target Ranges:  Prepandial:   less than 140 mg/dL      Peak postprandial:   less than 180 mg/dL (1-2 hours)      Critically ill patients:  140 - 180 mg/dL   Lab Results  Component Value Date   GLUCAP 165 (H) 08/09/2020   HGBA1C 8.2 (H) 08/06/2020    Review of Glycemic Control Results for John Nguyen, John Nguyen (MRN [id number]) as of 08/09/2020 11:55  Ref. Range 08/08/2020 21:18 08/09/2020 04:23 08/09/2020 04:52  Glucose-Capillary Latest Ref Range: 70 - 99 mg/dL 151 (H) 31 (LL) 761 (H)   Diabetes history:  DM1 Outpatient Diabetes medications:  NPH 35 units daily Current orders for Inpatient glycemic control:  NPH 35 units with breakfast Novolog 0-15 units tid  Inpatient Diabetes Program Recommendations:    Given recurring episodes of severe hypoglycemia, would consider changing insulin regimen (if patient is in agreement) to NPH 12 units BID.   Thanks,  Lujean Rave, MSN, RNC-OB Diabetes Coordinator [phone] (8a-5p)

## 2020-08-09 NOTE — Progress Notes (Addendum)
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 The foot will be followed by Dr. Lajoyce Corners. Arrangements have been made for follow-up in our office to follow his peripheral vascular disease.  Cari Caraway, MD [phone]    Subjective  - doing better and foot feels better.  Objective 134/68 95 98.3 F (36.8 C) (Oral) 16 96%  Intake/Output Summary (Last 24 hours) at 08/09/2020 6948 Last data filed at 08/09/2020 0500 Gross per 24 hour  Intake 3291.26 ml  Output 750 ml  Net 2541.26 ml   Right foot warm, doppler signals Dp/PT/peroneal Left groin soft without hematoma Right GT dry gangrene stable Heart RRR Lungs non labored breathing  Assessment/Planning: POD # 1  Procedure Performed:             1.  Ultrasound-guided access, left femoral artery             2.  Abdominal aortogram             3.  Bilateral lower extremity runoff             4.  Orbital atherectomy, right popliteal artery             5.  Orbital atherectomy, right tibioperoneal trunk and peroneal artery             6.  Stent, right popliteal artery             7.  Angioplasty, right tibioperoneal trunk and peroneal artery             8.  Conscious sedation, 89 minutes             9.  Closure device, Mynx   Patent arterial flow with doppler signals.  GT dry gangrene stable.  Plan for f/u with DR. Duda in 1 week he will arrange. Hypoglycemic event 35 blood glucose drank juice and now stable.  Patient is self medicating with insulin  F/U with Dr. Edilia Bo in 4 weeks arterial duplex left  and ABI's     John Nguyen 08/09/2020 7:33 AM --  I have interviewed the patient and examined the patient. I agree with the findings by  the PA. He underwent successful angioplasty and stenting of the right popliteal artery. 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 The foot will be followed by Dr. Lajoyce Corners. Arrangements have been made for follow-up in our office to follow his peripheral vascular disease.  Cari Caraway, MD [phone]  Laboratory Lab Results: Recent Labs    08/07/20 0327 08/09/20 0055  WBC 10.8* 11.5*  HGB 15.9 14.3  HCT 48.4 44.1  PLT 344 309   BMET Recent Labs    08/07/20 0327 08/09/20 0055  NA 138 136  K 4.6 3.9  CL 106 109  CO2 20* 21*  GLUCOSE 93 94  BUN 9 9  CREATININE 1.09 0.91  CALCIUM 9.1 8.5*    COAG No results found for: INR, PROTIME No results found for: PTT

## 2020-08-09 NOTE — Care Management (Signed)
1153 08-09-20 Case Manager received a consult for Primary Care Provider-Appointment scheduled at the [facility] and [facility]. Pharmacy is onsite and medications will range from $4.00-$10.00. Case Manager has spoken to pharmacists- will see if the patient is eligible for MATCH. Medications should be filled via [facility] Pharmacy. Gala Lewandowsky, RN,BSN Case Manager

## 2020-08-09 NOTE — Progress Notes (Signed)
PHARMACIST LIPID MONITORING   Martavius Lusty is a 64 y.o. male admitted on 08/06/2020 with PVD.  Pharmacy has been consulted to optimize lipid-lowering therapy with the indication of secondary prevention for clinical ASCVD.  Recent Labs:  Lipid Panel (last 6 months):   No results found for: CHOL, TRIG, HDL, CHOLHDL, VLDL, LDLCALC, LDLDIRECT  Hepatic function panel (last 6 months):   Lab Results  Component Value Date   AST 22 08/06/2020   ALT 19 08/06/2020   ALKPHOS 83 08/06/2020   BILITOT 1.1 08/06/2020    SCr (since admission):   Serum creatinine: 0.91 mg/dL 08/29/34 6701 Estimated creatinine clearance: 84.2 mL/min  Current lipid-lowering therapy: no statin PTA; now on atorvastatin 80mg /day Previous lipid-lowering therapies (if applicable): none Documented or reported allergies or intolerances to lipid-lowering therapies (if applicable): none  Assessment:  Patient agrees with changes to lipid-lowering therapy  Recommendation per protocol:  Increase intensity or dose of current statin.  Follow-up with: primary care provider  Follow-up labs after discharge:    Liver function panel and lipid panel in 8-12 weeks then annually  Plan: Increase atorvastatin to 80mg  po daily  10-12, PharmD Clinical Pharmacist **Pharmacist phone directory can now be found on [email or web address] ([email or web address]).  Listed under [facility].

## 2020-08-09 NOTE — Progress Notes (Signed)
Results for NICKLOUS, ABURTO (MRN [id number]) as of 08/09/2020 18:48  Ref. Range 08/09/2020 04:23 08/09/2020 04:52 08/09/2020 12:06 08/09/2020 14:17 08/09/2020 17:08  Glucose-Capillary Latest Ref Range: 70 - 99 mg/dL 31 (LL) 294 (H) 765 (H) 207 (H) 206 (H)    Patient reported to nurse he took 26 units of his own insulin for dinner.  Patient advised once again, he is not to be self administering his insulin while he is in the hospital. He stated it was passed his regular insulin time so he just gave it himself.

## 2020-08-09 NOTE — Discharge Instructions (Signed)
° °  [facility] [place] ° °Discharge Instructions ° °Lower Extremity Angiogram; Angioplasty/Stenting ° °Please refer to the following instructions for your post-procedure care. Your surgeon or physician assistant will discuss any changes with you. ° °Activity ° °Avoid lifting more than 8 pounds (1 gallons of milk) for 72 hours (3 days) after your procedure. You may walk as much as you can tolerate. It's OK to drive after 72 hours. ° °Bathing/Showering ° °You may shower the day after your procedure. If you have a bandage, you may remove it at 24- 48 hours. Clean your incision site with mild soap and water. Pat the area dry with a clean towel. ° °Diet ° °Resume your pre-procedure diet. There are no special food restrictions following this procedure. All patients with peripheral vascular disease should follow a low fat/low cholesterol diet. In order to heal from your surgery, it is CRITICAL to get adequate nutrition. Your body requires vitamins, minerals, and protein. Vegetables are the best source of vitamins and minerals. Vegetables also provide the perfect balance of protein. Processed food has little nutritional value, so try to avoid this. ° °Medications ° °Resume taking all of your medications unless your doctor tells you not to. If your incision is causing pain, you may take over-the-counter pain relievers such as acetaminophen (Tylenol) ° °Follow Up ° °Follow up will be arranged at the time of your procedure. You may have an office visit scheduled or may be scheduled for surgery. Ask your surgeon if you have any questions. ° °Please call us immediately for any of the following conditions: °•Severe or worsening pain your legs or feet at rest or with walking. °•Increased pain, redness, drainage at your groin puncture site. °•Fever of 101 degrees or higher. °•If you have any mild or slow bleeding from your puncture site: lie down, apply firm constant pressure over the area with a piece of  gauze or a clean wash cloth for 30 minutes- no peeking!, call 911 right away if you are still bleeding after 30 minutes, or if the bleeding is heavy and unmanageable. ° °Reduce your risk factors of vascular disease: ° °Stop smoking. If you would like help call [facility] at 1-800-QUIT-NOW (1-800-784-8669) or [facility] at 336-586-4000. °Manage your cholesterol °Maintain a desired weight °Control your diabetes °Keep your blood pressure down ° °If you have any questions, please call the office at [phone] ° °

## 2020-08-09 NOTE — Consult Note (Signed)
[facility]    Reason for Consult: right foot cellulitis/great toe gangrene    Referring Physician: Dr. Luberta Robertson  Assessment: Mr. Loh is 64yo male with type 1 diabetes admitted 10/25 for right foot cellulitis, right toe gangrene, started on vancomycin and cefepime 10/25. Imaging on arrival revealed soft tissue swelling, no osteomyelitis. BCx NGTD. Patient has remained afebrile and hemodynamically stable throughout admission. Vascular surgery consulted for peripheral vascular disease. Patient underwent successful angioplasty, stenting of right popliteal artery 10/27. 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 Patient has received four days of IV abx with adequate response, no signs of infection on exam. Most likely symptoms 2/2 poor blood flow.  Recommendations: - Can d/c vanc, cefepime - F/u ortho, vascular surgery outpatient  Antibiotics: Day 4 vancomycin, cefepime  HPI: John Nguyen is a 64 y.o. male with type 1 diabetes who presented to [facility] 10/25 with progressive right foot erythema and tenderness. HPI per chart review and patient interview. Patient reports injury to bilateral feet after a heavy object landed on both feet. Lost both toenails of great toes with regrowth of the left. States the right toe developed progressive edema, erythema, and tenderness. At home patient was able to drain purulent material 7 days after the accident and went to an urgent care clinic a few days later. Prescribed 10 days of cipro, Bactrim. Patient reports necrotic changes and worsening edema and erythema during the two days prior to arrival in the ED. Denies fevers, nausea, vomiting. During visit today, patient reports improvement of symptoms and erythema, edema on his right foot. Mentions he was instructed to exercise his lower extremities to augment adequate blood flow. Otherwise, denies recent fevers, chest pain,  shortness of breath.  Review of Systems: All other systems reviewed and are negative    Past Medical History:  Diagnosis Date  . Depression   . Diabetes mellitus without complication (HCC)   . Psoriasis   . Type 1 diabetes mellitus (HCC)     Social History   Tobacco Use  . Smoking status: Never Smoker  . Smokeless tobacco: Never Used  Vaping Use  . Vaping Use: Never used  Substance Use Topics  . Alcohol use: Yes    Comment: daily use  . Drug use: Yes    Types: Marijuana    Family History  Problem Relation Age of Onset  . Diabetes Mother   . Cancer Mother   . COPD Father     Allergies  Allergen Reactions  . Cymbalta [Duloxetine Hcl] Rash    Physical Exam: Vitals:   08/09/20 0744 08/09/20 1210  BP: (!) 156/74 (!) 155/83  Pulse: 99 90  Resp: 18 18  Temp: 97.8 F (36.6 C) 98.2 F (36.8 C)  SpO2: 100% 98%   Constitutional: Pleasant, no acute distress EYES: anicteric HENT: Normocephalic, atraumatic Cardiovascular: Regular rate, rhythm. No m/r/g Respiratory: Clear to auscultation bilaterally. GI: Soft, non-tender, non-distended. Musculoskeletal: No pitting edema bilaterally. Skin: R great toe necrotic, no purulence. R foot mildly erythematous, chronic skin changes. Medial 5th toe necrotic skin changes. Neuro: Alert, awake, oriented x3  Lab Results  Component Value Date   WBC 11.5 (H) 08/09/2020   HGB 14.3 08/09/2020   HCT 44.1 08/09/2020   MCV 91.7 08/09/2020   PLT 309 08/09/2020    Lab Results  Component Value Date   CREATININE 0.91 08/09/2020   BUN 9 08/09/2020   NA 136 08/09/2020   K 3.9 08/09/2020  CL 109 08/09/2020   CO2 21 (L) 08/09/2020    Lab Results  Component Value Date   ALT 19 08/06/2020   AST 22 08/06/2020   ALKPHOS 83 08/06/2020     Microbiology: Recent Results (from the past 240 hour(s))  Respiratory Panel by RT PCR (Flu A&B, Covid) - Nasal Mucosa     Status: None   Collection Time: 08/06/20  2:11 PM   Specimen: Nasal  Mucosa; Nasopharyngeal  Result Value Ref Range Status   SARS Coronavirus 2 by RT PCR NEGATIVE NEGATIVE Final    Comment: (NOTE) SARS-CoV-2 target nucleic acids are NOT DETECTED.  The SARS-CoV-2 RNA is generally detectable in upper respiratoy specimens during the acute phase of infection. The lowest concentration of SARS-CoV-2 viral copies this assay can detect is 131 copies/mL. A negative result does not preclude SARS-Cov-2 infection and should not be used as the sole basis for treatment or other patient management decisions. A negative result may occur with  improper specimen collection/handling, submission of specimen other than nasopharyngeal swab, presence of viral mutation(s) within the areas targeted by this assay, and inadequate number of viral copies (<131 copies/mL). A negative result must be combined with clinical observations, patient history, and epidemiological information. The expected result is Negative.  Fact Sheet for Patients:  https://www.moore.com/  Fact Sheet for Healthcare Providers:  https://www.young.biz/  This test is no t yet approved or cleared by the [place] FDA and  has been authorized for detection and/or diagnosis of SARS-CoV-2 by FDA under an Emergency Use Authorization (EUA). This EUA will remain  in effect (meaning this test can be used) for the duration of the COVID-19 declaration under Section 564(b)(1) of the Act, 21 U.S.C. section 360bbb-3(b)(1), unless the authorization is terminated or revoked sooner.     Influenza A by PCR NEGATIVE NEGATIVE Final   Influenza B by PCR NEGATIVE NEGATIVE Final    Comment: (NOTE) The Xpert Xpress SARS-CoV-2/FLU/RSV assay is intended as an aid in  the diagnosis of influenza from Nasopharyngeal swab specimens and  should not be used as a sole basis for treatment. Nasal washings and  aspirates are unacceptable for Xpert Xpress SARS-CoV-2/FLU/RSV  testing.  Fact  Sheet for Patients: https://www.moore.com/  Fact Sheet for Healthcare Providers: https://www.young.biz/  This test is not yet approved or cleared by the [place] FDA and  has been authorized for detection and/or diagnosis of SARS-CoV-2 by  FDA under an Emergency Use Authorization (EUA). This EUA will remain  in effect (meaning this test can be used) for the duration of the  Covid-19 declaration under Section 564(b)(1) of the Act, 21  U.S.C. section 360bbb-3(b)(1), unless the authorization is  terminated or revoked. Performed at [facility], [address] [place], [place] [address]   MRSA PCR Screening     Status: None   Collection Time: 08/06/20  2:11 PM   Specimen: Nasal Mucosa; Nasopharyngeal  Result Value Ref Range Status   MRSA by PCR NEGATIVE NEGATIVE Final    Comment:        The GeneXpert MRSA Assay (FDA approved for NASAL specimens only), is one component of a comprehensive MRSA colonization surveillance program. It is not intended to diagnose MRSA infection nor to guide or monitor treatment for MRSA infections. Performed at [facility], [address], [place], [place] [address]   Culture, blood (routine x 2)     Status: None (Preliminary result)   Collection Time: 08/06/20  3:00 PM  Specimen: BLOOD  Result Value Ref Range Status   Specimen Description   Final    BLOOD LEFT ANTECUBITAL Performed at [facility], [address], [place], [place] [address]    Special Requests   Final    BOTTLES DRAWN AEROBIC AND ANAEROBIC Blood Culture results may not be optimal due to an inadequate volume of blood received in culture bottles Performed at [facility], [address] [place], [place] [address]    Culture   Final    NO GROWTH 3 DAYS Performed at [facility], [address] [place], [place] [address]    Report Status PENDING   Incomplete  Culture, blood (routine x 2)     Status: None (Preliminary result)   Collection Time: 08/06/20  3:00 PM   Specimen: BLOOD  Result Value Ref Range Status   Specimen Description   Final    BLOOD RIGHT ANTECUBITAL Performed at [facility], [address] [place], [place] [address]    Special Requests   Final    BOTTLES DRAWN AEROBIC AND ANAEROBIC Blood Culture adequate volume Performed at [facility], [address], [place], [place] [address]    Culture   Final    NO GROWTH 3 DAYS Performed at [facility], [address], [place], [place] [address]    Report Status PENDING  Incomplete    Evlyn Kanner, MD Internal Medicine PGY-1 [id number]

## 2020-08-10 MED FILL — ASPIRIN LOW DOSE 81 MG TBEC: 81 | 30 days supply | Qty: 30 | Fill #0

## 2020-08-10 MED FILL — ATORVASTATIN CALCIUM 80 MG: 80 | 30 days supply | Qty: 30 | Fill #0

## 2020-08-10 MED FILL — HumuLIN N 100 UNIT/ML SUSP: 100 | 30 days supply | Qty: 10 | Fill #0

## 2020-08-10 NOTE — Discharge Summary (Signed)
John Nguyen WUJ:[id number] DOB: 18-May-1956 DOA: 08/06/2020  PCP: Pcp, No  Admit date: 08/06/2020  Discharge date: 08/10/2020  Admitted From: Home   disposition: Home   Recommendations for Outpatient Follow-up:   Follow up with PCP in 1-2 weeks Follow-up with orthopedics as already scheduled  Home Health: N/A Equipment/Devices: None Consultations: Orthopedics Discharge Condition: Improved CODE STATUS: Full Diet Recommendation: Heart Healthy   Diet Order            Diet - low sodium heart healthy                  Chief Complaint  Patient presents with  . Foot Pain     Brief history of present illness from the day of admission and additional interim summary    This 64 year old male with past medical history of type 1 diabetes mellitus, He presents with worsening right foot cellulitis for the last 3 weeks, despite oral antibiotic therapy for 10 days. Lesion predominantly at the distal right foot, first metatarsal. Positive necrotic changes. Right foot x-ray with soft tissue swelling of the first digit without clear evidence of osteomyelitis. Patient placed on broad spectrum antibiotic therapy. Unable to perform MRI due to hardware present.                                                                  Hospital Course   Patient was started on vancomycin and cefepime on 1025 for presumed cellulitis without osteomyelitis and what appears to be gangrene.  Patient was seen by orthopedics and vascular surgery.  Patient underwent successful angioplasty stenting of right popliteal artery on 1027.  He does however have severe tibial artery occlusive disease without options for revascularization.  Patient noted significant improvement after his stenting.  Noted that the dusky look of his first toe was much  improved and he had significantly decreased pain in his foot.  Patient was seen by infectious diseases who noted no further antibiotic therapy was necessary.  Patient is discharged home after vascularization on DAPT with aspirin and Plavix to follow-up with vascular surgery.  Right foot gangrene Seen by ID who note no further antibiotic therapy necessary Status post stenting of right popliteal artery although has persistent severe tibial artery occlusive disease without options for revascularization. Patient discharged home on DAPT with aspirin and Plavix The huge importance of continuing to stay on aspirin and Plavix is reiterated with the patient and he states he understands.  DM 1 Patient states he has been managing his own diabetes for more than 20 years without a physician. Notes he buys his NPH over-the-counter without a prescription. States he cannot afford to see a physician and does not want a referral to a PCP. Patient seen by diabetes coordinator in house and NPH regimen was changed to  12 twice daily to decrease episodes of hypoglycemia.    Discharge diagnosis     Principal Problem:   Cellulitis Active Problems:   Diabetes mellitus with peripheral vascular disease (HCC)   Psoriasis   Depression   Gangrene of toe of right foot (HCC)   PVD (peripheral vascular disease) (HCC)    Discharge instructions    Discharge Instructions    Diet - low sodium heart healthy   Complete by: As directed    Discharge instructions   Complete by: As directed    1.  It is absolutely essential that you take both Aspirin and Plavix every day.  If you miss any doses of this, the stents they put in to increase the blood flow in your legs might get blocked off.  Make sure you take your aspirin and Plavix every day as prescribed.   2.  Stop taking your ibuprofen and indomethacin otherwise your blood will get too thin. 3.  Change how you take your NPH.  Start taking it 12 units twice a day in the  morning and before suppertime rather than one larger dose once a day. 4.  Make sure you keep your orthopedics appointment as scheduled. 5.  Come back if you develop fevers or chills or if your foot starts looking red and painful again. 6.  You will need to get your hydrocodone (Vicodin) at [facility] and will need to pay for yourself.   Discharge wound care:   Complete by: As directed    Per orthopedics instructions.   Increase activity slowly   Complete by: As directed       Discharge Medications   Allergies as of 08/09/2020      Reactions   Cymbalta [duloxetine Hcl] Rash      Medication List    STOP taking these medications   ciprofloxacin 500 MG tablet Commonly known as: CIPRO   ibuprofen 200 MG tablet Commonly known as: ADVIL   indomethacin 25 MG capsule Commonly known as: INDOCIN   sulfamethoxazole-trimethoprim 800-160 MG tablet Commonly known as: BACTRIM DS     TAKE these medications   aspirin 81 MG EC tablet Take 1 tablet (81 mg total) by mouth daily. Swallow whole.   atorvastatin 80 MG tablet Commonly known as: LIPITOR Take 1 tablet (80 mg total) by mouth daily.   clopidogrel 75 MG tablet Commonly known as: PLAVIX Take 1 tablet (75 mg total) by mouth daily with breakfast.   diphenhydrAMINE 25 mg capsule Commonly known as: BENADRYL Take 25 mg by mouth every 6 (six) hours as needed for itching or allergies.   HYDROcodone-acetaminophen 5-325 MG tablet Commonly known as: Norco Take 1-2 tablets by mouth every 6 (six) hours as needed for up to 7 days for moderate pain.   insulin NPH Human 100 UNIT/ML injection Commonly known as: NOVOLIN N Inject 0.12 mLs (12 Units total) into the skin 2 (two) times daily. What changed:   how much to take  when to take this   multivitamin with minerals Tabs tablet Take 1 tablet by mouth daily.            Discharge Care Instructions  (From admission, onward)         Start     Ordered   08/09/20 0000   Discharge wound care:       Comments: Per orthopedics instructions.   08/09/20 1710           Follow-up Information    Nada Libman, MD  Follow up in 4 week(s).   Specialties: Vascular Surgery, Cardiology Contact information: [address] [place] [address] [phone]        [facility] Follow up on 08/22/2020.   Why: @ 2:50 pm with Dr. Sharon Seller for hospital follow up appointment. If you cannot make the scheduled appointment; please call to reschedule. Pharmacy onsite and medications will cost $4.00-10.00 Contact information: [address] [place] [address] [phone]       Nadara Mustard, MD Follow up in 1 week(s).   Specialty: Orthopedic Surgery Contact information: [address] [place] [address] [phone]               Major procedures and Radiology Reports - PLEASE review detailed and final reports thoroughly  -        PERIPHERAL VASCULAR CATHETERIZATION  Result Date: 08/08/2020 Patient name: Jessee Mezera MRN: [id number] DOB: [birth year] Sex: male 08/08/2020 Pre-operative Diagnosis: Right toe ulcer Post-operative diagnosis:  Same Surgeon:  Durene Cal Procedure Performed:  1.  Ultrasound-guided access, left femoral artery  2.  Abdominal aortogram  3.  Bilateral lower extremity runoff  4.  Orbital atherectomy, right popliteal artery  5.  Orbital atherectomy, right tibioperoneal trunk and peroneal artery  6.  Stent, right popliteal artery  7.  Angioplasty, right tibioperoneal trunk and peroneal artery  8.  Conscious sedation, 89 minutes  9.  Closure device, Mynx Indications: This is a 64 year old gentleman with type 1 diabetes and gangrenous right great toe who comes in today for arterial evaluation and possible intervention Procedure:  The patient was identified in the holding area and taken to room 8.  The patient was then placed supine on the table and prepped and draped in the usual sterile  fashion.  A time out was called.  Conscious sedation was administered with the use of IV fentanyl and Versed under continuous physician and nurse monitoring.  Heart rate, blood pressure, and oxygen saturation were continuously monitored.  Total sedation time was 89 minutes.  Ultrasound was used to evaluate the left common femoral artery.  It was patent .  A digital ultrasound image was acquired.  A micropuncture needle was used to access the left common femoral artery under ultrasound guidance.  An 018 wire was advanced without resistance and a micropuncture sheath was placed.  The 018 wire was removed and a benson wire was placed.  The micropuncture sheath was exchanged for a 5 french sheath.  An omniflush catheter was advanced over the wire to the level of L-1.  An abdominal angiogram was obtained.  Next, the Omni Flush catheter was pulled down to the bifurcation and bilateral runoff was performed. Findings:  Aortogram: No significant renal artery stenosis.  The infrarenal abdominal aorta is widely patent.  Bilateral common and external iliac arteries are widely patent.  Right Lower Extremity: The right common femoral profundofemoral and superficial femoral artery are widely patent.  There is a segment of the above-knee popliteal artery with diffuse disease with stenosis greater than 70%.  The below-knee popliteal artery is widely patent.  There is diffuse tibial disease.  The anterior tibial and posterior tibial artery are occluded in the mid leg.  There is a near occlusive lesion of the distal tibioperoneal trunk going into the peroneal artery which is patent however very small in caliber.  Minimal opacification of the arteries across the ankle  Left Lower Extremity: The left common femoral and profundofemoral artery are widely patent.  Superficial femoral artery and popliteal artery are widely patent.  There is severe tibial disease with occlusion of all 3 vessels and reconstitution of the posterior tibial  artery at the ankle. Intervention: After the above images were obtained the decision made to proceed with intervention.  A 7 French 45 cm sheath was advanced into the right common femoral artery.  The patient was fully heparinized.  Next using a 3 5 Glidewire and a quick cross catheter, the superficial femoral artery was selected and the wire was navigated across the lesion in the popliteal artery.  Similarly the Glidewire was advanced across the lesion in the tibioperoneal trunk.  The quick cross catheter was then placed into the peroneal artery.  A Viper wire was then inserted.  Next, a CSI classic device was used to perform orbital atherectomy in the popliteal artery.  Down the back passes were made at low medium and high speeds.  Orbital atherectomy was then performed across the tibioperoneal trunk and peroneal lesion going down and back at low and medium speeds.  Next, a 3 x 40 Sterling balloon was used to perform balloon angioplasty of the tibioperoneal trunk and peroneal artery.  I then performed drug-coated balloon angioplasty of the popliteal stenosis using a 5 x 80 Ranger balloon.  Completion imaging was then performed.  This showed a widely patent tibioperoneal trunk intervention.  There is still residual stenosis within the popliteal artery.  I therefore elected to stent the popliteal artery with a 6 x 100 INNOVA stent which was postdilated with a 5 mm balloon.  Completion imaging showed resolution of the stenosis of the treated areas.  There continues to be poor opacification of muscles across the ankle.  A minx device was used for closure Impression:  #1  Significant stenosis within the above-knee popliteal artery treated using orbital atherectomy with a 1.5 CSI device followed by drug-coated balloon angioplasty with residual stenosis requiring stenting using a 6 x 100.  #2  Successful orbital atherectomy of the right tibioperoneal trunk and peroneal artery using a 1.5 classic device and a 3 mm balloon  with resolution of the stenosis  #3  Minimal opacification of vessels across the ankle on the right  #4  Severe tibial disease on the left V. Durene CalWells Brabham, M.D., [facility] [place] Office: [phone] Pager:  [phone]  DG Foot Complete Right  Result Date: 08/06/2020 CLINICAL DATA:  Cellulitis, first ray necrosis EXAM: RIGHT FOOT COMPLETE - 3+ VIEW COMPARISON:  Radiograph 07/27/2020 FINDINGS: Soft tissue swelling of the first digit without clear evidence of osteomyelitis. Small cortical erosion noted at the head of the fifth metatarsal, nonspecific in can be seen with sequela of prior infection/osteomyelitis as well as erosive arthropathy. Redemonstrated linear foreign body seen along the plantar aspect at the metatarsal heads and at the plantar aspect of the first interphalangeal joint. Progressive clawtoe deformities of the second through fifth ray. Additional varus deformity of the fifth ray noted, progressive from prior imaging. IMPRESSION: 1. Soft tissue swelling of the first digit without clear evidence of osteomyelitis. 2. Small cortical erosion noted at the head of the fifth metatarsal, nonspecific in can be seen with sequela of prior infection/osteomyelitis as well as erosive arthropathy. 3. Stable indeterminate foreign bodies in the volar aspect of the first and fifth rays. 4. Quintus varus and progressive clawtoe deformities of the second through fifth ray. Electronically Signed   By: Kreg ShropshirePrice  DeHay M.D.   On: 08/06/2020 15:52   [facility] [place]  ABI WITH/WO TBI  Result Date: 08/07/2020 LOWER EXTREMITY DOPPLER STUDY Indications: Gangrene. High Risk Factors: Diabetes.  Limitations: Today's exam was limited due to patient positioning, an open wound              and patient movement. Comparison Study: No prior studies. Performing Technologist: Olen Cordial RVT  Examination Guidelines: A complete evaluation includes at minimum, Doppler waveform signals and systolic blood  pressure reading at the level of bilateral brachial, anterior tibial, and posterior tibial arteries, when vessel segments are accessible. Bilateral testing is considered an integral part of a complete examination. Photoelectric Plethysmograph (PPG) waveforms and toe systolic pressure readings are included as required and additional duplex testing as needed. Limited examinations for reoccurring indications may be performed as noted.  ABI Findings: +--------+------------------+-----+----------+--------+ Right   Rt Pressure (mmHg)IndexWaveform  Comment  +--------+------------------+-----+----------+--------+ [id number]                    triphasic          +--------+------------------+-----+----------+--------+ PTA     129               0.71 biphasic           +--------+------------------+-----+----------+--------+ DP      91                0.50 monophasic         +--------+------------------+-----+----------+--------+ +---------+------------------+-----+----------+-------+ Left     Lt Pressure (mmHg)IndexWaveform  Comment +---------+------------------+-----+----------+-------+ Brachial 143                    triphasic         +---------+------------------+-----+----------+-------+ PTA      174               0.96 biphasic          +---------+------------------+-----+----------+-------+ DP       74                0.41 monophasic        +---------+------------------+-----+----------+-------+ Great Toe30                0.16                   +---------+------------------+-----+----------+-------+ +-------+-----------+-----------+------------+------------+ ABI/TBIToday's ABIToday's TBIPrevious ABIPrevious TBI +-------+-----------+-----------+------------+------------+ Right  0.71                                           +-------+-----------+-----------+------------+------------+ Left   0.96       0.16                                 +-------+-----------+-----------+------------+------------+  Summary: Right: Resting right ankle-brachial index indicates moderate right lower extremity arterial disease. Values are likely unreliable due to medial calcification. Left: Resting left ankle-brachial index is within normal range. No evidence of significant left lower extremity arterial disease. The left toe-brachial index is abnormal. Values are likely unreliable due to medial calcification.  *See table(s) above for measurements and observations.  Electronically signed by Waverly Ferrari MD on 08/07/2020 at 5:39:08 PM.    Final     Micro Results    Recent Results (from the past 240 hour(s))  Respiratory Panel by RT PCR (Flu A&B, Covid) - Nasal Mucosa     Status: None   Collection  Time: 08/06/20  2:11 PM   Specimen: Nasal Mucosa; Nasopharyngeal  Result Value Ref Range Status   SARS Coronavirus 2 by RT PCR NEGATIVE NEGATIVE Final    Comment: (NOTE) SARS-CoV-2 target nucleic acids are NOT DETECTED.  The SARS-CoV-2 RNA is generally detectable in upper respiratoy specimens during the acute phase of infection. The lowest concentration of SARS-CoV-2 viral copies this assay can detect is 131 copies/mL. A negative result does not preclude SARS-Cov-2 infection and should not be used as the sole basis for treatment or other patient management decisions. A negative result may occur with  improper specimen collection/handling, submission of specimen other than nasopharyngeal swab, presence of viral mutation(s) within the areas targeted by this assay, and inadequate number of viral copies (<131 copies/mL). A negative result must be combined with clinical observations, patient history, and epidemiological information. The expected result is Negative.  Fact Sheet for Patients:  https://www.moore.com/  Fact Sheet for Healthcare Providers:  https://www.young.biz/  This test is no t yet approved or  cleared by the [place] FDA and  has been authorized for detection and/or diagnosis of SARS-CoV-2 by FDA under an Emergency Use Authorization (EUA). This EUA will remain  in effect (meaning this test can be used) for the duration of the COVID-19 declaration under Section 564(b)(1) of the Act, 21 U.S.C. section 360bbb-3(b)(1), unless the authorization is terminated or revoked sooner.     Influenza A by PCR NEGATIVE NEGATIVE Final   Influenza B by PCR NEGATIVE NEGATIVE Final    Comment: (NOTE) The Xpert Xpress SARS-CoV-2/FLU/RSV assay is intended as an aid in  the diagnosis of influenza from Nasopharyngeal swab specimens and  should not be used as a sole basis for treatment. Nasal washings and  aspirates are unacceptable for Xpert Xpress SARS-CoV-2/FLU/RSV  testing.  Fact Sheet for Patients: https://www.moore.com/  Fact Sheet for Healthcare Providers: https://www.young.biz/  This test is not yet approved or cleared by the [place] FDA and  has been authorized for detection and/or diagnosis of SARS-CoV-2 by  FDA under an Emergency Use Authorization (EUA). This EUA will remain  in effect (meaning this test can be used) for the duration of the  Covid-19 declaration under Section 564(b)(1) of the Act, 21  U.S.C. section 360bbb-3(b)(1), unless the authorization is  terminated or revoked. Performed at [facility], [address], [place], [place] [address]   MRSA PCR Screening     Status: None   Collection Time: 08/06/20  2:11 PM   Specimen: Nasal Mucosa; Nasopharyngeal  Result Value Ref Range Status   MRSA by PCR NEGATIVE NEGATIVE Final    Comment:        The GeneXpert MRSA Assay (FDA approved for NASAL specimens only), is one component of a comprehensive MRSA colonization surveillance program. It is not intended to diagnose MRSA infection nor to guide or monitor treatment for MRSA infections. Performed at  [facility], [address], [place], [place] [address]   Culture, blood (routine x 2)     Status: None (Preliminary result)   Collection Time: 08/06/20  3:00 PM   Specimen: BLOOD  Result Value Ref Range Status   Specimen Description   Final    BLOOD LEFT ANTECUBITAL Performed at [facility], [address] [place], [place] [address]    Special Requests   Final    BOTTLES DRAWN AEROBIC AND ANAEROBIC Blood Culture results may not be optimal due to an inadequate volume of blood received in  culture bottles Performed at [facility], [address], [place], [place] [address]    Culture   Final    NO GROWTH 4 DAYS Performed at [facility], [address] [place], [place] [address]    Report Status PENDING  Incomplete  Culture, blood (routine x 2)     Status: None (Preliminary result)   Collection Time: 08/06/20  3:00 PM   Specimen: BLOOD  Result Value Ref Range Status   Specimen Description   Final    BLOOD RIGHT ANTECUBITAL Performed at [facility], [address], [place], [place] [address]    Special Requests   Final    BOTTLES DRAWN AEROBIC AND ANAEROBIC Blood Culture adequate volume Performed at [facility], [address] [place], [place] [address]    Culture   Final    NO GROWTH 4 DAYS Performed at [facility], [address] [place], [place] [address]    Report Status PENDING  Incomplete    Today   Subjective    Spiro Ausborn feels much improved since admission.  Feels ready to go home.  Denies chest pain, shortness of breath or abdominal pain.  Feels they can take care of themselves with the resources they have at home.  Objective   Blood pressure (!) 162/91, pulse 98, temperature 98.3 F (36.8 C), temperature source Oral, resp. rate 18, height 5\' 9"  (1.753 m), weight 81.9 kg, SpO2 98 %.  No intake or output data in the 24 hours ending 08/10/20  1748  Exam General: Patient appears well and in good spirits sitting up in bed in no acute distress.  Eyes: sclera anicteric, conjuctiva mild injection bilaterally CVS: S1-S2, regular  Respiratory:  decreased air entry bilaterally secondary to decreased inspiratory effort, rales at bases  GI: NABS, soft, NT  LE:  Persistent but improving duskiness of right great toe, patient does have gangrene of right great toe. Neuro: A/O x 3, Moving all extremities equally with normal strength, CN 3-12 intact, grossly nonfocal.  Psych: patient is logical and coherent, judgement and insight appear normal, mood and affect appropriate to situation.    Data Review   CBC w Diff:  Lab Results  Component Value Date   WBC 11.5 (H) 08/09/2020   HGB 14.3 08/09/2020   HCT 44.1 08/09/2020   PLT 309 08/09/2020   LYMPHOPCT 16 08/06/2020   MONOPCT 8 08/06/2020   EOSPCT 5 08/06/2020   BASOPCT 2 08/06/2020    CMP:  Lab Results  Component Value Date   NA 136 08/09/2020   K 3.9 08/09/2020   CL 109 08/09/2020   CO2 21 (L) 08/09/2020   BUN 9 08/09/2020   CREATININE 0.91 08/09/2020   PROT 6.6 08/06/2020   ALBUMIN 3.8 08/06/2020   BILITOT 1.1 08/06/2020   ALKPHOS 83 08/06/2020   AST 22 08/06/2020   ALT 19 08/06/2020  .   Total Time in preparing paper work, data evaluation and todays exam - 35 minutes  08/08/2020 M.D on 08/10/2020 at 5:48 PM  [facility]   Office  [phone]

## 2020-08-11 LAB — CULTURE, BLOOD (ROUTINE X 2)
Culture: NO GROWTH
Culture: NO GROWTH
Special Requests: ADEQUATE

## 2020-08-15 ENCOUNTER — Inpatient Hospital Stay: Payer: Self-pay | Admitting: Family

## 2020-08-16 NOTE — Progress Notes (Signed)
Patient ID: John Nguyen, male   DOB: 13-May-1956, 64 y.o.   MRN: [id number]   Virtual Visit via Telephone Note  I connected with John Nguyen on 08/22/20 at  2:50 PM EST by telephone and verified that I am speaking with the correct person using two identifiers.  Location: Patient:  John Nguyen Provider: Georgian Co, PA-C   I discussed the limitations, risks, security and privacy concerns of performing an evaluation and management service by telephone and the availability of in person appointments. I also discussed with the patient that there may be a patient responsible charge related to this service. The patient expressed understanding and agreed to proceed.   PATIENT visit by telephone virtually in the context of Covid-19 pandemic. Patient location: home My Location:  [facility] office Persons on the call:  Me, the patient, screened by Carolynne Edouard    History of Present Illness: After hospitalization 10/25-10/29/2021 with PVD tibial artery occlusive disease.  He has a procedure scheduled in 2 days.  He has uncontrolled DM.  He has been managing this at home for the last 20 years on NPH.  He is taking 35 units once a day.  The hospital changed the dose to 12 units twice a day but he is unwilling to try that at this time.  He is willing to take a statin and an ace.  Blood sugars running <low 200s.  He denies any new issues or concerns.    From discharge summary: This86 year old male with past medical history of type 1 diabetes mellitus,He presents with worsening right footcellulitisfor the last 3 weeks, despite oral antibiotic therapy for 10 days. Lesion predominantly at the distal right foot, first metatarsal. Positive necrotic changes. Right foot x-ray with soft tissue swelling of the first digit without clear evidence of osteomyelitis. Patient placed on broad spectrum antibiotic therapy. Unable to perform MRI due to hardware present.     Patient was started on vancomycin and  cefepime on 1025 for presumed cellulitis without osteomyelitis and what appears to be gangrene.  Patient was seen by orthopedics and vascular surgery.  Patient underwent successful angioplasty stenting of right popliteal artery on 1027.  He does however have severe tibial artery occlusive disease without options for revascularization.  Patient noted significant improvement after his stenting.  Noted that the dusky look of his first toe was much improved and he had significantly decreased pain in his foot.  Patient was seen by infectious diseases who noted no further antibiotic therapy was necessary.  Patient is discharged home after vascularization on DAPT with aspirin and Plavix to follow-up with vascular surgery.  Right foot gangrene Seen by ID who note no further antibiotic therapy necessary Status post stenting of right popliteal artery although has persistent severe tibial artery occlusive disease without options for revascularization. Patient discharged home on DAPT with aspirin and Plavix The huge importance of continuing to stay on aspirin and Plavix is reiterated with the patient and he states he understands.  DM 1 Patient states he has been managing his own diabetes for more than 20 years without a physician. Notes he buys his NPH over-the-counter without a prescription. States he cannot afford to see a physician and does not want a referral to a PCP. Patient seen by diabetes coordinator in house and NPH regimen was changed to 12 twice daily to decrease episodes of hypoglycemia.   Discharge diagnoses:  Principal Problem:   Cellulitis Active Problems:   Diabetes mellitus with peripheral vascular disease (HCC)   Psoriasis  Depression   Gangrene of toe of right foot (HCC)   PVD (peripheral vascular disease) (HCC)    Observations/Objective:  NAD.  A&Ox3   Assessment and Plan: 1. Diabetes mellitus with peripheral vascular disease (HCC) He is unwilling, at this time to change  his NPH regimen.  He is currently taking 35 units once daily.  I advised he could take 18 units twice daily with meals.  His a1C was 8.2 07/2020.  Check blood sugars tid - atorvastatin (LIPITOR) 80 MG tablet; Take 1 tablet (80 mg total) by mouth daily.  Dispense: 30 tablet; Refill: 3 - lisinopril (ZESTRIL) 5 MG tablet; Take 1 tablet (5 mg total) by mouth daily.  Dispense: 90 tablet; Refill: 3  2. Elevated blood pressure reading And needs kidney protection - lisinopril (ZESTRIL) 5 MG tablet; Take 1 tablet (5 mg total) by mouth daily.  Dispense: 90 tablet; Refill: 3  3. Gangrene of toe of right foot (HCC) F/up with vascular as scheduled.  4. PVD (peripheral vascular disease) (HCC) - atorvastatin (LIPITOR) 80 MG tablet; Take 1 tablet (80 mg total) by mouth daily.  Dispense: 30 tablet; Refill: 3  5. Hospital discharge follow-up    Follow Up Instructions: Assign PCP in 2 months   I discussed the assessment and treatment plan with the patient. The patient was provided an opportunity to ask questions and all were answered. The patient agreed with the plan and demonstrated an understanding of the instructions.   The patient was advised to call back or seek an in-person evaluation if the symptoms worsen or if the condition fails to improve as anticipated.  I provided 19 minutes of non-face-to-face time during this encounter.   Georgian Co, PA-C

## 2020-08-20 ENCOUNTER — Encounter: Payer: Self-pay | Admitting: Orthopedic Surgery

## 2020-08-20 ENCOUNTER — Ambulatory Visit (INDEPENDENT_AMBULATORY_CARE_PROVIDER_SITE_OTHER): Payer: Self-pay | Admitting: Orthopedic Surgery

## 2020-08-20 VITALS — Ht 69.0 in | Wt 182.0 lb

## 2020-08-20 DIAGNOSIS — I96 Gangrene, not elsewhere classified: Secondary | ICD-10-CM

## 2020-08-20 MED ORDER — HYDROCODONE-ACETAMINOPHEN 5-325 MG PO TABS: 1.0000 | ORAL_TABLET | ORAL | 0 refills | Status: DC | PRN

## 2020-08-20 NOTE — Progress Notes (Signed)
Office Visit Note   Patient: John Nguyen           Date of Birth: 06-01-1956           MRN: [id number] Visit Date: 08/20/2020              Requested by: No referring provider defined for this encounter. PCP: Pcp, No  Chief Complaint  Patient presents with  . Right Foot - Pain      HPI: Patient is a 64 year old gentleman who presents for initial evaluation after revascularization.  Patient is status post stenting for the right lower extremity.  Patient complains of increasing ischemic pain across the forefoot with pain worse in the great toe third fourth and fifth toes.  Patient states the ischemic pain has recently gotten worse.  Assessment & Plan: Visit Diagnoses:  1. Gangrene of toe of right foot (HCC)     Plan: Will plan for a transmetatarsal amputation.  Discussed risks of the wound not healing need for additional surgery.  Patient states he understands wished to proceed as soon as possible we will set this up for Wednesday.  Patient states he does not have a physician to care for his ongoing medical conditions.  We will make a referral to health and wellness.  Prescription called in for Vicodin for pain.  Follow-Up Instructions: Return in about 2 weeks (around 09/03/2020).   Ortho Exam  Patient is alert, oriented, no adenopathy, well-dressed, normal affect, normal respiratory effort. Examination patient has dry gangrene of the great toe to the IP joint.  He has ischemic ulcers to the third fourth and fifth toes.  There is no ascending cellulitis.  Doppler was used and he has a strong posterior tibial biphasic pulse and a weak dorsalis pedis biphasic pulse.  Patient is currently on Plavix and aspirin.  Imaging: No results found. No images are attached to the encounter.  Labs: Lab Results  Component Value Date   HGBA1C 8.2 (H) 08/06/2020   REPTSTATUS 08/11/2020 FINAL 08/06/2020   REPTSTATUS 08/11/2020 FINAL 08/06/2020   CULT  08/06/2020    NO GROWTH 5  DAYS Performed at [facility], [address], [place], [place] [address]    CULT  08/06/2020    NO GROWTH 5 DAYS Performed at [facility], [address], [place], [place] [address]      Lab Results  Component Value Date   ALBUMIN 3.8 08/06/2020   ALBUMIN 3.8 08/20/2009    Lab Results  Component Value Date   MG 2.0 08/09/2020   No results found for: VD25OH  No results found for: PREALBUMIN CBC EXTENDED Latest Ref Rng & Units 08/09/2020 08/07/2020 08/06/2020  WBC 4.0 - 10.5 K/uL 11.5(H) 10.8(H) 11.6(H)  RBC 4.22 - 5.81 MIL/uL 4.81 5.22 5.29  HGB 13.0 - 17.0 g/dL 09.9 83.3 82.5  HCT 39 - 52 % 44.1 48.4 48.2  PLT 150 - 400 K/uL 309 344 346  NEUTROABS 1.7 - 7.7 K/uL - - 8.0(H)  LYMPHSABS 0.7 - 4.0 K/uL - - 1.8     Body mass index is 26.88 kg/m.  Orders:  No orders of the defined types were placed in this encounter.  No orders of the defined types were placed in this encounter.    Procedures: No procedures performed  Clinical Data: No additional findings.  ROS:  All other systems negative, except as noted in the HPI. Review of Systems  Objective: Vital Signs: Ht 5\' 9"  (1.753 m)  Wt 182 lb (82.6 kg)   BMI 26.88 kg/m   Specialty Comments:  No specialty comments available.  PMFS History: Patient Active Problem List   Diagnosis Date Noted  . Gangrene of toe of right foot (HCC)   . PVD (peripheral vascular disease) (HCC)   . Cellulitis 08/06/2020  . Diabetes mellitus with peripheral vascular disease (HCC) 08/06/2020  . Psoriasis 08/06/2020  . Depression 08/06/2020   Past Medical History:  Diagnosis Date  . Depression   . Diabetes mellitus without complication (HCC)   . Psoriasis   . Type 1 diabetes mellitus (HCC)     Family History  Problem Relation Age of Onset  . Diabetes Mother   . Cancer Mother   . COPD Father     Past Surgical History:  Procedure Laterality Date  . ABDOMINAL AORTOGRAM W/LOWER EXTREMITY N/A 08/08/2020    Procedure: ABDOMINAL AORTOGRAM W/LOWER EXTREMITY;  Surgeon: Nada Libman, MD;  Location: [facility];  Service: Cardiovascular;  Laterality: N/A;  . arm fracture surgery Left   . PERIPHERAL VASCULAR ATHERECTOMY  08/08/2020   Procedure: PERIPHERAL VASCULAR ATHERECTOMY;  Surgeon: Nada Libman, MD;  Location: [facility];  Service: Cardiovascular;;  . PERIPHERAL VASCULAR INTERVENTION  08/08/2020   Procedure: PERIPHERAL VASCULAR INTERVENTION;  Surgeon: Nada Libman, MD;  Location: [facility];  Service: Cardiovascular;;   Social History   Occupational History  . Not on file  Tobacco Use  . Smoking status: Never Smoker  . Smokeless tobacco: Never Used  Vaping Use  . Vaping Use: Never used  Substance and Sexual Activity  . Alcohol use: Yes    Comment: daily use  . Drug use: Yes    Types: Marijuana  . Sexual activity: Not on file

## 2020-08-21 ENCOUNTER — Other Ambulatory Visit: Payer: Self-pay | Admitting: Physician Assistant

## 2020-08-22 ENCOUNTER — Other Ambulatory Visit: Payer: Self-pay

## 2020-08-22 ENCOUNTER — Ambulatory Visit: Payer: Self-pay | Attending: Physician Assistant | Admitting: Physician Assistant

## 2020-08-22 ENCOUNTER — Encounter: Payer: Self-pay | Admitting: Physician Assistant

## 2020-08-22 DIAGNOSIS — Z09 Encounter for follow-up examination after completed treatment for conditions other than malignant neoplasm: Secondary | ICD-10-CM

## 2020-08-22 DIAGNOSIS — I96 Gangrene, not elsewhere classified: Secondary | ICD-10-CM

## 2020-08-22 DIAGNOSIS — R030 Elevated blood-pressure reading, without diagnosis of hypertension: Secondary | ICD-10-CM

## 2020-08-22 DIAGNOSIS — E1151 Type 2 diabetes mellitus with diabetic peripheral angiopathy without gangrene: Secondary | ICD-10-CM

## 2020-08-22 DIAGNOSIS — I739 Peripheral vascular disease, unspecified: Secondary | ICD-10-CM

## 2020-08-22 MED ORDER — ATORVASTATIN CALCIUM 80 MG PO TABS: 80.0000 mg | ORAL_TABLET | Freq: Every day | ORAL | 3 refills | Status: AC

## 2020-08-22 MED ORDER — LISINOPRIL 5 MG PO TABS: 5.0000 mg | ORAL_TABLET | Freq: Every day | ORAL | 3 refills | Status: AC

## 2020-08-23 ENCOUNTER — Other Ambulatory Visit (HOSPITAL_COMMUNITY)
Admission: RE | Admit: 2020-08-23 | Discharge: 2020-08-23 | Disposition: A | Discharge status: Home / Self Care | Payer: Self-pay | Source: Ambulatory Visit | Attending: Orthopedic Surgery | Admitting: Orthopedic Surgery

## 2020-08-23 DIAGNOSIS — Z20822 Contact with and (suspected) exposure to covid-19: Secondary | ICD-10-CM | POA: Insufficient documentation

## 2020-08-23 DIAGNOSIS — Z01812 Encounter for preprocedural laboratory examination: Secondary | ICD-10-CM | POA: Insufficient documentation

## 2020-08-23 LAB — SARS CORONAVIRUS 2 (TAT 6-24 HRS): SARS Coronavirus 2: NEGATIVE

## 2020-08-23 MED FILL — LISINOPRIL 5 MG TABLET: 5 | 30 days supply | Qty: 30 | Fill #0

## 2020-08-23 NOTE — Progress Notes (Signed)
-------------  Pt did not answer phone call. All instructions left on voicemail.   SDW INSTRUCTIONS:  Your procedure is scheduled on 08/24/20.  Report to [facility] Main Entrance "A" at 0710 A.M., and check in at the Admitting office.  Call this number if you have problems the morning of surgery: [phone]   Remember: Do not eat after midnight the night before your surgery  You may drink clear liquids until 0640 the morning of your surgery.   Clear liquids allowed are: Water, Non-Citrus Juices (without pulp), Carbonated Beverages, Clear Tea, Black Coffee Only, and Gatorade   Take these medicines the morning of surgery with A SIP OF WATER: acetaminophen (TYLENOL) if needed diphenhydrAMINE (BENADRYL) if needed HYDROcodone-acetaminophen (NORCO/VICODIN) if needed atorvastatin (LIPITOR)  insulin NPH Human (NOVOLIN N) 24.5 units PM dose 28 units morning of surgery ** PLEASE check your blood sugar the morning of your surgery when you wake up and every 2 hours until you get to the Short Stay unit.  If your blood sugar is less than 70 mg/dL, you will need to treat for low blood sugar: - Do not take insulin. - Treat a low blood sugar (less than 70 mg/dL) with  cup of clear juice (cranberry or apple), 4 glucose tablets, OR glucose gel. - Recheck blood sugar in 15 minutes after treatment (to make sure it is greater than 70 mg/dL). If your blood sugar is not greater than 70 mg/dL on recheck, call [phone] for further instructions.   Follow your surgeon's instructions on when to stop Aspirin and clopidogrel (PLAVIX).   As of today, STOP taking any Aleve, Naproxen, Ibuprofen, Motrin, Advil, Goody's, BC's, all herbal medications, fish oil, and all vitamins.    The Morning of Surgery  Do not wear jewelry  Do not wear lotions, powders, colognes, or deodorant  Men may shave face and neck.  Do not bring valuables to the hospital.  [facility] is not responsible for any belongings  or valuables.  If you are a smoker, DO NOT Smoke 24 hours prior to surgery  If you wear a CPAP at night please bring your mask the morning of surgery   Remember that you must have someone to transport you home after your surgery, and remain with you for 24 hours if you are discharged the same day.  Please bring cases for contacts, glasses, hearing aids, dentures or bridgework because it cannot be worn into surgery.   Leave your suitcase in the car.  After surgery it may be brought to your room.  For patients admitted to the hospital, discharge time will be determined by your treatment team.  Patients discharged the day of surgery will not be allowed to drive home.    Special instructions:   [place]- Preparing For Surgery  Oral Hygiene is also important to reduce your risk of infection.  Remember - BRUSH YOUR TEETH THE MORNING OF SURGERY WITH YOUR REGULAR TOOTHPASTE  Please follow these instructions carefully.   1. Shower the NIGHT BEFORE SURGERY and the MORNING OF SURGERY with DIAL Soap.   2. Wash thoroughly, paying special attention to the area where your surgery will be performed.  3. Thoroughly rinse your body with warm water from the neck down.  4. Pat yourself dry with a CLEAN TOWEL.  5. Wear CLEAN PAJAMAS to bed the night before surgery  6. Place CLEAN SHEETS on your bed the night of your first shower and DO NOT SLEEP WITH PETS.  7. Wear comfortable  clothes the morning of surgery.    Day of Surgery:  Please shower the morning of surgery with the DIAL soap Do not apply any deodorants/lotions. Please wear clean clothes to the hospital/surgery center.   Remember to brush your teeth WITH YOUR REGULAR TOOTHPASTE.   Please read over the following fact sheets that you were given.  Patient denies shortness of breath, fever, cough and chest pain.

## 2020-08-24 ENCOUNTER — Other Ambulatory Visit: Payer: Self-pay

## 2020-08-24 ENCOUNTER — Ambulatory Visit (HOSPITAL_COMMUNITY): Payer: Self-pay | Admitting: Anesthesiology

## 2020-08-24 ENCOUNTER — Encounter (HOSPITAL_COMMUNITY)
Admission: RE | Disposition: A | Discharge status: Home / Self Care | Payer: Self-pay | Source: Home / Self Care | Attending: Orthopedic Surgery

## 2020-08-24 ENCOUNTER — Ambulatory Visit (HOSPITAL_COMMUNITY)
Admission: RE | Admit: 2020-08-24 | Discharge: 2020-08-24 | Disposition: A | Discharge status: Home / Self Care | Payer: Self-pay | Attending: Orthopedic Surgery | Admitting: Orthopedic Surgery

## 2020-08-24 ENCOUNTER — Encounter (HOSPITAL_COMMUNITY): Payer: Self-pay | Admitting: Orthopedic Surgery

## 2020-08-24 DIAGNOSIS — Z888 Allergy status to other drugs, medicaments and biological substances status: Secondary | ICD-10-CM | POA: Insufficient documentation

## 2020-08-24 DIAGNOSIS — Z833 Family history of diabetes mellitus: Secondary | ICD-10-CM | POA: Insufficient documentation

## 2020-08-24 DIAGNOSIS — I517 Cardiomegaly: Secondary | ICD-10-CM | POA: Insufficient documentation

## 2020-08-24 DIAGNOSIS — Z825 Family history of asthma and other chronic lower respiratory diseases: Secondary | ICD-10-CM | POA: Insufficient documentation

## 2020-08-24 DIAGNOSIS — Z809 Family history of malignant neoplasm, unspecified: Secondary | ICD-10-CM | POA: Insufficient documentation

## 2020-08-24 DIAGNOSIS — E1052 Type 1 diabetes mellitus with diabetic peripheral angiopathy with gangrene: Secondary | ICD-10-CM | POA: Insufficient documentation

## 2020-08-24 DIAGNOSIS — L409 Psoriasis, unspecified: Secondary | ICD-10-CM | POA: Insufficient documentation

## 2020-08-24 DIAGNOSIS — I70261 Atherosclerosis of native arteries of extremities with gangrene, right leg: Secondary | ICD-10-CM | POA: Insufficient documentation

## 2020-08-24 DIAGNOSIS — M86171 Other acute osteomyelitis, right ankle and foot: Secondary | ICD-10-CM

## 2020-08-24 DIAGNOSIS — Z794 Long term (current) use of insulin: Secondary | ICD-10-CM | POA: Insufficient documentation

## 2020-08-24 HISTORY — PX: AMPUTATION: SHX166

## 2020-08-24 HISTORY — DX: Anxiety disorder, unspecified: F41.9

## 2020-08-24 HISTORY — DX: Essential (primary) hypertension: I10

## 2020-08-24 LAB — POCT I-STAT, CHEM 8
BUN: 15 mg/dL (ref 8–23)
Calcium, Ion: 1.14 mmol/L — ABNORMAL LOW (ref 1.15–1.40)
Chloride: 106 mmol/L (ref 98–111)
Creatinine, Ser: 0.8 mg/dL (ref 0.61–1.24)
Glucose, Bld: 163 mg/dL — ABNORMAL HIGH (ref 70–99)
HCT: 50 % (ref 39.0–52.0)
Hemoglobin: 17 g/dL (ref 13.0–17.0)
Potassium: 4.7 mmol/L (ref 3.5–5.1)
Sodium: 139 mmol/L (ref 135–145)
TCO2: 24 mmol/L (ref 22–32)

## 2020-08-24 LAB — GLUCOSE, CAPILLARY
Glucose-Capillary: 172 mg/dL — ABNORMAL HIGH (ref 70–99)
Glucose-Capillary: 171 mg/dL — ABNORMAL HIGH (ref 70–99)

## 2020-08-24 SURGERY — AMPUTATION, FOOT, RAY
Anesthesia: General | Site: Foot | Laterality: Right

## 2020-08-24 MED ORDER — CEFAZOLIN SODIUM-DEXTROSE 2-4 GM/100ML-% IV SOLN
2.0000 g | INTRAVENOUS | Status: AC
  Administered 2020-08-24: 2 g via INTRAVENOUS
  Filled 2020-08-24: qty 100

## 2020-08-24 MED ORDER — ONDANSETRON HCL 4 MG/2ML IJ SOLN: 4.0000 mg | Freq: Once | INTRAMUSCULAR | Status: DC | PRN

## 2020-08-24 MED ORDER — 0.9 % SODIUM CHLORIDE (POUR BTL) OPTIME
TOPICAL | Status: DC | PRN
  Administered 2020-08-24: 1000 mL

## 2020-08-24 MED ORDER — FENTANYL CITRATE (PF) 100 MCG/2ML IJ SOLN
INTRAMUSCULAR | Status: DC | PRN
  Administered 2020-08-24: 50 ug via INTRAVENOUS

## 2020-08-24 MED ORDER — FENTANYL CITRATE (PF) 250 MCG/5ML IJ SOLN
INTRAMUSCULAR | Status: AC
  Filled 2020-08-24: qty 5

## 2020-08-24 MED ORDER — ACETAMINOPHEN 325 MG PO TABS: 325.0000 mg | ORAL_TABLET | ORAL | Status: DC | PRN

## 2020-08-24 MED ORDER — PROPOFOL 10 MG/ML IV BOLUS
INTRAVENOUS | Status: AC
  Filled 2020-08-24: qty 20

## 2020-08-24 MED ORDER — ORAL CARE MOUTH RINSE: 15.0000 mL | Freq: Once | OROMUCOSAL | Status: DC

## 2020-08-24 MED ORDER — PROPOFOL 10 MG/ML IV BOLUS
INTRAVENOUS | Status: DC | PRN
  Administered 2020-08-24 (×2): 30 mg via INTRAVENOUS

## 2020-08-24 MED ORDER — BUPIVACAINE LIPOSOME 1.3 % IJ SUSP
INTRAMUSCULAR | Status: DC | PRN
  Administered 2020-08-24: 10 mL

## 2020-08-24 MED ORDER — OXYCODONE HCL 5 MG/5ML PO SOLN: 5.0000 mg | Freq: Once | ORAL | Status: DC | PRN

## 2020-08-24 MED ORDER — CHLORHEXIDINE GLUCONATE 0.12 % MT SOLN
15.0000 mL | Freq: Once | OROMUCOSAL | Status: DC
  Filled 2020-08-24: qty 15

## 2020-08-24 MED ORDER — MIDAZOLAM HCL 2 MG/2ML IJ SOLN: 2.0000 mg | Freq: Once | INTRAMUSCULAR | Status: AC

## 2020-08-24 MED ORDER — OXYCODONE-ACETAMINOPHEN 5-325 MG PO TABS: 1.0000 | ORAL_TABLET | ORAL | 0 refills | Status: DC | PRN

## 2020-08-24 MED ORDER — ONDANSETRON HCL 4 MG/2ML IJ SOLN
INTRAMUSCULAR | Status: DC | PRN
  Administered 2020-08-24: 4 mg via INTRAVENOUS

## 2020-08-24 MED ORDER — LACTATED RINGERS IV SOLN: INTRAVENOUS | Status: DC

## 2020-08-24 MED ORDER — FENTANYL CITRATE (PF) 100 MCG/2ML IJ SOLN
INTRAMUSCULAR | Status: AC
  Administered 2020-08-24: 100 ug via INTRAVENOUS
  Filled 2020-08-24: qty 2

## 2020-08-24 MED ORDER — OXYCODONE HCL 5 MG PO TABS: 5.0000 mg | ORAL_TABLET | Freq: Once | ORAL | Status: DC | PRN

## 2020-08-24 MED ORDER — ONDANSETRON HCL 4 MG/2ML IJ SOLN
INTRAMUSCULAR | Status: AC
  Filled 2020-08-24: qty 2

## 2020-08-24 MED ORDER — PROPOFOL 500 MG/50ML IV EMUL
INTRAVENOUS | Status: DC | PRN
  Administered 2020-08-24: 75 ug/kg/min via INTRAVENOUS

## 2020-08-24 MED ORDER — FENTANYL CITRATE (PF) 100 MCG/2ML IJ SOLN: 25.0000 ug | INTRAMUSCULAR | Status: DC | PRN

## 2020-08-24 MED ORDER — MEPERIDINE HCL 25 MG/ML IJ SOLN: 6.2500 mg | INTRAMUSCULAR | Status: DC | PRN

## 2020-08-24 MED ORDER — FENTANYL CITRATE (PF) 100 MCG/2ML IJ SOLN: 100.0000 ug | Freq: Once | INTRAMUSCULAR | Status: AC

## 2020-08-24 MED ORDER — BUPIVACAINE-EPINEPHRINE (PF) 0.5% -1:200000 IJ SOLN
INTRAMUSCULAR | Status: DC | PRN
  Administered 2020-08-24: 20 mL

## 2020-08-24 MED ORDER — MIDAZOLAM HCL 2 MG/2ML IJ SOLN
INTRAMUSCULAR | Status: AC
  Administered 2020-08-24: 2 mg via INTRAVENOUS
  Filled 2020-08-24: qty 2

## 2020-08-24 MED ORDER — ACETAMINOPHEN 160 MG/5ML PO SOLN: 325.0000 mg | ORAL | Status: DC | PRN

## 2020-08-24 SURGICAL SUPPLY — 31 items
BLADE SAW SGTL MED 73X18.5 STR (BLADE) IMPLANT
BLADE SURG 21 STRL SS (BLADE) ×3 IMPLANT
BNDG COHESIVE 4X5 TAN STRL (GAUZE/BANDAGES/DRESSINGS) ×3 IMPLANT
BNDG GAUZE ELAST 4 BULKY (GAUZE/BANDAGES/DRESSINGS) ×3 IMPLANT
COVER SURGICAL LIGHT HANDLE (MISCELLANEOUS) ×6 IMPLANT
COVER WAND RF STERILE (DRAPES) ×3 IMPLANT
DRAPE DERMATAC (DRAPES) ×2 IMPLANT
DRAPE U-SHAPE 47X51 STRL (DRAPES) ×6 IMPLANT
DRSG ADAPTIC 3X8 NADH LF (GAUZE/BANDAGES/DRESSINGS) ×3 IMPLANT
DRSG PAD ABDOMINAL 8X10 ST (GAUZE/BANDAGES/DRESSINGS) ×6 IMPLANT
DURAPREP 26ML APPLICATOR (WOUND CARE) ×3 IMPLANT
ELECT REM PT RETURN 9FT ADLT (ELECTROSURGICAL) ×3
ELECTRODE REM PT RTRN 9FT ADLT (ELECTROSURGICAL) ×1 IMPLANT
GAUZE SPONGE 4X4 12PLY STRL (GAUZE/BANDAGES/DRESSINGS) ×3 IMPLANT
GLOVE BIOGEL PI IND STRL 9 (GLOVE) ×1 IMPLANT
GLOVE BIOGEL PI INDICATOR 9 (GLOVE) ×2
GLOVE SURG ORTHO 9.0 STRL STRW (GLOVE) ×3 IMPLANT
GOWN STRL REUS W/ TWL XL LVL3 (GOWN DISPOSABLE) ×2 IMPLANT
GOWN STRL REUS W/TWL XL LVL3 (GOWN DISPOSABLE) ×6
KIT BASIN OR (CUSTOM PROCEDURE TRAY) ×3 IMPLANT
KIT DRSG PREVENA PLUS 7DAY 125 (MISCELLANEOUS) ×2 IMPLANT
KIT TURNOVER KIT B (KITS) ×3 IMPLANT
NS IRRIG 1000ML POUR BTL (IV SOLUTION) ×3 IMPLANT
PACK ORTHO EXTREMITY (CUSTOM PROCEDURE TRAY) ×3 IMPLANT
PAD ARMBOARD 7.5X6 YLW CONV (MISCELLANEOUS) ×6 IMPLANT
STOCKINETTE IMPERVIOUS LG (DRAPES) IMPLANT
SUT ETHILON 2 0 PSLX (SUTURE) ×3 IMPLANT
TOWEL GREEN STERILE (TOWEL DISPOSABLE) ×3 IMPLANT
TUBE CONNECTING 12'X1/4 (SUCTIONS) ×1
TUBE CONNECTING 12X1/4 (SUCTIONS) ×2 IMPLANT
YANKAUER SUCT BULB TIP NO VENT (SUCTIONS) ×3 IMPLANT

## 2020-08-24 NOTE — Progress Notes (Signed)
Orthopedic Tech Progress Note Patient Details:  John Nguyen 29-Aug-1956 [id number]  Ortho Devices Type of Ortho Device: Postop shoe/boot, Crutches Ortho Device/Splint Location: RLE Ortho Device/Splint Interventions: Application, Ordered   Post Interventions Patient Tolerated: Well Instructions Provided: Adjustment of device, Care of device   John Nguyen A John Nguyen 08/24/2020, 12:14 PM

## 2020-08-24 NOTE — H&P (Signed)
John Nguyen is an 64 y.o. male.   Chief Complaint: Right Foot osteomyelitis HPI: Patient is a 64 year old gentleman who presents for initial evaluation after revascularization.  Patient is status post stenting for the right lower extremity.  Patient complains of increasing ischemic pain across the forefoot with pain worse in the great toe third fourth and fifth toes.  Patient states the ischemic pain has recently gotten worse.  Past Medical History:  Diagnosis Date  . Depression   . Diabetes mellitus without complication (HCC)   . Psoriasis   . Type 1 diabetes mellitus (HCC)     Past Surgical History:  Procedure Laterality Date  . ABDOMINAL AORTOGRAM W/LOWER EXTREMITY N/A 08/08/2020   Procedure: ABDOMINAL AORTOGRAM W/LOWER EXTREMITY;  Surgeon: Nada Libman, MD;  Location: [facility];  Service: Cardiovascular;  Laterality: N/A;  . arm fracture surgery Left   . PERIPHERAL VASCULAR ATHERECTOMY  08/08/2020   Procedure: PERIPHERAL VASCULAR ATHERECTOMY;  Surgeon: Nada Libman, MD;  Location: [facility];  Service: Cardiovascular;;  . PERIPHERAL VASCULAR INTERVENTION  08/08/2020   Procedure: PERIPHERAL VASCULAR INTERVENTION;  Surgeon: Nada Libman, MD;  Location: [facility];  Service: Cardiovascular;;    Family History  Problem Relation Age of Onset  . Diabetes Mother   . Cancer Mother   . COPD Father    Social History:  reports that he has never smoked. He has never used smokeless tobacco. He reports current alcohol use. He reports current drug use. Drug: Marijuana.  Allergies:  Allergies  Allergen Reactions  . Cymbalta [Duloxetine Hcl] Rash    No medications prior to admission.    Results for orders placed or performed during the hospital encounter of 08/23/20 (from the past 48 hour(s))  SARS CORONAVIRUS 2 (TAT 6-24 HRS) Nasopharyngeal Nasopharyngeal Swab     Status: None   Collection Time: 08/23/20 10:11 AM   Specimen: Nasopharyngeal Swab   Result Value Ref Range   SARS Coronavirus 2 NEGATIVE NEGATIVE    Comment: (NOTE) SARS-CoV-2 target nucleic acids are NOT DETECTED.  The SARS-CoV-2 RNA is generally detectable in upper and lower respiratory specimens during the acute phase of infection. Negative results do not preclude SARS-CoV-2 infection, do not rule out co-infections with other pathogens, and should not be used as the sole basis for treatment or other patient management decisions. Negative results must be combined with clinical observations, patient history, and epidemiological information. The expected result is Negative.  Fact Sheet for Patients: HairSlick.no  Fact Sheet for Healthcare Providers: quierodirigir.com  This test is not yet approved or cleared by the [place] FDA and  has been authorized for detection and/or diagnosis of SARS-CoV-2 by FDA under an Emergency Use Authorization (EUA). This EUA will remain  in effect (meaning this test can be used) for the duration of the COVID-19 declaration under Se ction 564(b)(1) of the Act, 21 U.S.C. section 360bbb-3(b)(1), unless the authorization is terminated or revoked sooner.  Performed at [facility], [address], [place], [place] [address]    No results found.  Review of Systems  All other systems reviewed and are negative.   There were no vitals taken for this visit. Physical Exam  Patient is alert, oriented, no adenopathy, well-dressed, normal affect, normal respiratory effort. Examination patient has dry gangrene of the great toe to the IP joint.  He has ischemic ulcers to the third fourth and fifth toes.  There is no ascending cellulitis.  Doppler was  used and he has a strong posterior tibial biphasic pulse and a weak dorsalis pedis biphasic pulse.  Patient is currently on Plavix and aspirin.Heart RRR Lungs Clear Assessment/Plan 1. Gangrene of toe of right foot (HCC)      Plan: Will plan for a transmetatarsal amputation.  Discussed risks of the wound not healing need for additional surgery.  Patient states he understands wished to proceed as soon as possible we will set this up for Wednesday.  Patient states he does not have a physician to care for his ongoing medical conditions.  We will make a referral to health and wellness.  Prescription called in for Vicodin for pain.   [place] Rie Mcneil, PA 08/24/2020, 6:39 AM

## 2020-08-24 NOTE — Interval H&P Note (Signed)
History and Physical Interval Note:  08/24/2020 9:25 AM  John Nguyen  has presented today for surgery, with the diagnosis of Gangrene Right Forefoot.  The various methods of treatment have been discussed with the patient and family. After consideration of risks, benefits and other options for treatment, the patient has consented to  Procedure(s): RIGHT TRANSMETATARSAL AMPUTATION (Right) as a surgical intervention.  The patient's history has been reviewed, patient examined, no change in status, stable for surgery.  I have reviewed the patient's chart and labs.  Questions were answered to the patient's satisfaction.     Nadara Mustard

## 2020-08-24 NOTE — Anesthesia Preprocedure Evaluation (Addendum)
Anesthesia Evaluation  Patient identified by MRN, date of birth, ID band Patient awake    Reviewed: Allergy & Precautions, H&P , NPO status , Patient's Chart, lab work & pertinent test results, reviewed documented beta blocker date and time   Airway Mallampati: II  TM Distance: >3 FB Neck ROM: full    Dental no notable dental hx. (+) Poor Dentition, Chipped, Missing, Loose, Dental Advisory Given   Pulmonary neg pulmonary ROS,    Pulmonary exam normal breath sounds clear to auscultation       Cardiovascular Exercise Tolerance: Good hypertension, + Peripheral Vascular Disease   Rhythm:regular Rate:Normal     Neuro/Psych PSYCHIATRIC DISORDERS Anxiety Depression negative neurological ROS     GI/Hepatic negative GI ROS, Neg liver ROS,   Endo/Other  diabetes, Type 1  Renal/GU negative Renal ROS  negative genitourinary   Musculoskeletal   Abdominal   Peds  Hematology negative hematology ROS (+)   Anesthesia Other Findings   Reproductive/Obstetrics negative OB ROS                           Anesthesia Physical Anesthesia Plan  ASA: III  Anesthesia Plan: General   Post-op Pain Management: GA combined w/ Regional for post-op pain   Induction:   PONV Risk Score and Plan: 2 and Ondansetron and Treatment may vary due to age or medical condition  Airway Management Planned: Oral ETT and LMA  Additional Equipment:   Intra-op Plan:   Post-operative Plan: Extubation in OR  Informed Consent: I have reviewed the patients History and Physical, chart, labs and discussed the procedure including the risks, benefits and alternatives for the proposed anesthesia with the patient or authorized representative who has indicated his/her understanding and acceptance.     Dental Advisory Given  Plan Discussed with: CRNA and Anesthesiologist  Anesthesia Plan Comments: (Discussed both nerve block for pain  relief post-op and GA; including NV, sore throat, dental injury, and pulmonary complications)        Anesthesia Quick Evaluation

## 2020-08-24 NOTE — Anesthesia Procedure Notes (Signed)
Anesthesia Regional Block: Popliteal block   Pre-Anesthetic Checklist: ,, timeout performed, Correct Patient, Correct Site, Correct Laterality, Correct Procedure, Correct Position, site marked, Risks and benefits discussed,  Surgical consent,  Pre-op evaluation,  At surgeon's request and post-op pain management  Laterality: Right  Prep: chloraprep       Needles:  Injection technique: Single-shot  Needle Type: Echogenic Stimulator Needle     Needle Length: 5cm  Needle Gauge: 22     Additional Needles:   Procedures:, nerve stimulator,,, ultrasound used (permanent image in chart),,,,   Nerve Stimulator or Paresthesia:  Response: quadraceps contraction, 0.45 mA,   Additional Responses:   Narrative:  Start time: 08/24/2020 8:58 AM End time: 08/24/2020 9:06 AM Injection made incrementally with aspirations every 5 mL.  Performed by: Personally  Anesthesiologist: Bethena Midget, MD  Additional Notes: Functioning IV was confirmed and monitors were applied.  A 70mm 22ga Arrow echogenic stimulator needle was used. Sterile prep and drape,hand hygiene and sterile gloves were used. Ultrasound guidance: relevant anatomy identified, needle position confirmed, local anesthetic spread visualized around nerve(s)., vascular puncture avoided.  Image printed for medical record. Negative aspiration and negative test dose prior to incremental administration of local anesthetic. The patient tolerated the procedure well.

## 2020-08-24 NOTE — Anesthesia Postprocedure Evaluation (Signed)
Anesthesia Post Note  Patient: John Nguyen  Procedure(s) Performed: RIGHT TRANSMETATARSAL AMPUTATION (Right Foot)     Patient location during evaluation: PACU Anesthesia Type: MAC and Regional Level of consciousness: awake and alert Pain management: pain level controlled Vital Signs Assessment: post-procedure vital signs reviewed and stable Respiratory status: spontaneous breathing, nonlabored ventilation, respiratory function stable and patient connected to nasal cannula oxygen Cardiovascular status: stable and blood pressure returned to baseline Postop Assessment: no apparent nausea or vomiting Anesthetic complications: no   No complications documented.  Last Vitals:  Vitals:   08/24/20 1017 08/24/20 1035  BP: (!) 171/90 (!) 159/92  Pulse: 86 90  Resp: 17 15  Temp:  36.5 C  SpO2: 99% 99%    Last Pain:  Vitals:   08/24/20 1035  TempSrc:   PainSc: 0-No pain                 Alois Mincer

## 2020-08-24 NOTE — Transfer of Care (Signed)
Immediate Anesthesia Transfer of Care Note  Patient: John Nguyen  Procedure(s) Performed: RIGHT TRANSMETATARSAL AMPUTATION (Right Foot)  Patient Location: PACU  Anesthesia Type:MAC and Regional  Level of Consciousness: awake, alert  and oriented  Airway & Oxygen Therapy: Patient Spontanous Breathing and Patient connected to face mask oxygen  Post-op Assessment: Report given to RN and Post -op Vital signs reviewed and stable  Post vital signs: Reviewed and stable  Last Vitals:  Vitals Value Taken Time  BP 150/85 08/24/20 1002  Temp    Pulse 86 08/24/20 1002  Resp 16 08/24/20 1002  SpO2 100 % 08/24/20 1002  Vitals shown include unvalidated device data.  Last Pain:  Vitals:   08/24/20 0810  TempSrc: Oral  PainSc: 0-No pain      Patients Stated Pain Goal: 3 (64/68/03 2122)  Complications: No complications documented.

## 2020-08-24 NOTE — Op Note (Signed)
08/24/2020  10:07 AM  PATIENT:  John Nguyen    PRE-OPERATIVE DIAGNOSIS:  Gangrene Right Forefoot  POST-OPERATIVE DIAGNOSIS:  Same  PROCEDURE:  RIGHT TRANSMETATARSAL AMPUTATION  SURGEON:  Newt Minion, MD  PHYSICIAN ASSISTANT:None ANESTHESIA:   General  PREOPERATIVE INDICATIONS:  John Nguyen is a  64 y.o. male with a diagnosis of Gangrene Right Forefoot who failed conservative measures and elected for surgical management.    The risks benefits and alternatives were discussed with the patient preoperatively including but not limited to the risks of infection, bleeding, nerve injury, cardiopulmonary complications, the need for revision surgery, among others, and the patient was willing to proceed.  OPERATIVE IMPLANTS: none  _0 @  OPERATIVE FINDINGS: Minimal petechial bleeding no abscess  OPERATIVE PROCEDURE: Patient was brought the operating room after undergoing MAC anesthetic and a regional block the right lower extremity was prepped using DuraPrep draped into a sterile field a timeout was called.  A fishmouth incision was made just proximal to the cellulitis and ischemic tissue of the forefoot.  This was carried down to bone and oscillating saw was used to perform a transmetatarsal amputation.  There was minimal petechial bleeding the electrocautery was used hemostasis the wound was irrigated with normal saline the incision was closed using 2-0 nylon a 13 cm Prevena wound VAC was applied this had a good suction fit it was overwrapped with Covan plan for discharge to home.  Patient was taken the PACU in stable condition   DISCHARGE PLANNING:  Antibiotic duration: Preoperative antibiotics  Weightbearing: Touchdown weightbearing on the right  Pain medication: Percocet  Dressing care/ Wound VAC: Wound VAC  Ambulatory devices: Crutches  Discharge to: Home.  Follow-up: In the office 1 week post operative.

## 2020-08-25 ENCOUNTER — Encounter (HOSPITAL_COMMUNITY): Payer: Self-pay | Admitting: Orthopedic Surgery

## 2020-08-29 ENCOUNTER — Encounter (HOSPITAL_COMMUNITY): Payer: Self-pay

## 2020-08-31 ENCOUNTER — Ambulatory Visit (INDEPENDENT_AMBULATORY_CARE_PROVIDER_SITE_OTHER): Payer: Self-pay | Admitting: Family

## 2020-08-31 ENCOUNTER — Encounter: Payer: Self-pay | Admitting: Family

## 2020-08-31 DIAGNOSIS — Z89431 Acquired absence of right foot: Secondary | ICD-10-CM

## 2020-08-31 MED ORDER — GABAPENTIN 100 MG PO CAPS: 100.0000 mg | ORAL_CAPSULE | Freq: Three times a day (TID) | ORAL | 0 refills | Status: DC

## 2020-08-31 MED ORDER — OXYCODONE-ACETAMINOPHEN 5-325 MG PO TABS: 1.0000 | ORAL_TABLET | ORAL | 0 refills | Status: DC | PRN

## 2020-08-31 NOTE — Progress Notes (Signed)
Post-Op Visit Note   Patient: John Nguyen           Date of Birth: 04-Nov-1955           MRN: [id number] Visit Date: 08/31/2020 PCP: Pcp, No  Chief Complaint:  Chief Complaint  Patient presents with  . Right Foot - Routine Post Op    08/24/20 right transmet amputation     HPI:  HPI The patient is a 64 year old gentleman seen in 1 week status post right transmetatarsal amputation he has his wound VAC in place.  He complains of phantom pain states that he still feels the pain in his toes that he felt preop for definitely.  Difficulty sleeping and resting due to pain feels his Percocet only gives him about 2 hours of relief Ortho Exam On examination of the right transmetatarsal amputation the incision is well approximated was sutures there is no gaping there is scant bloody drainage there is no surrounding erythema minimal edema no sign of infection  Visit Diagnoses:  1. S/P transmetatarsal amputation of foot, right (HCC)     Plan: Begin daily Dial soap cleansing.  Dry dressings.  Continue nonweightbearing.  We will begin him on some gabapentin to try to assist with the phantom pain  Follow-Up Instructions: Return in about 2 weeks (around 09/14/2020).   Imaging: No results found.  Orders:  No orders of the defined types were placed in this encounter.  Meds ordered this encounter  Medications  . gabapentin (NEURONTIN) 100 MG capsule    Sig: Take 1 capsule (100 mg total) by mouth 3 (three) times daily.    Dispense:  90 capsule    Refill:  0  . oxyCODONE-acetaminophen (PERCOCET) 5-325 MG tablet    Sig: Take 1 tablet by mouth every 4 (four) hours as needed.    Dispense:  42 tablet    Refill:  0     PMFS History: Patient Active Problem List   Diagnosis Date Noted  . Osteomyelitis of foot, right, acute (HCC)   . Gangrene of toe of right foot (HCC)   . PVD (peripheral vascular disease) (HCC)   . Cellulitis 08/06/2020  . Diabetes mellitus with peripheral vascular disease  (HCC) 08/06/2020  . Psoriasis 08/06/2020  . Depression 08/06/2020   Past Medical History:  Diagnosis Date  . Anxiety   . Depression   . Diabetes mellitus without complication (HCC)   . Hypertension   . Psoriasis   . Type 1 diabetes mellitus (HCC)     Family History  Problem Relation Age of Onset  . Diabetes Mother   . Cancer Mother   . COPD Father     Past Surgical History:  Procedure Laterality Date  . ABDOMINAL AORTOGRAM W/LOWER EXTREMITY N/A 08/08/2020   Procedure: ABDOMINAL AORTOGRAM W/LOWER EXTREMITY;  Surgeon: Nada Libman, MD;  Location: [facility];  Service: Cardiovascular;  Laterality: N/A;  . AMPUTATION Right 08/24/2020   Procedure: RIGHT TRANSMETATARSAL AMPUTATION;  Surgeon: Nadara Mustard, MD;  [facility];  Service: Orthopedics;  Laterality: Right;  . arm fracture surgery Left   . PERIPHERAL VASCULAR ATHERECTOMY  08/08/2020   Procedure: PERIPHERAL VASCULAR ATHERECTOMY;  Surgeon: Nada Libman, MD;  Location: [facility];  Service: Cardiovascular;;  . PERIPHERAL VASCULAR INTERVENTION  08/08/2020   Procedure: PERIPHERAL VASCULAR INTERVENTION;  Surgeon: Nada Libman, MD;  Location: [facility];  Service: Cardiovascular;;   Social History   Occupational History  . Not on  file  Tobacco Use  . Smoking status: Never Smoker  . Smokeless tobacco: Never Used  Vaping Use  . Vaping Use: Former  Substance and Sexual Activity  . Alcohol use: Not Currently    Comment: stopped drinking about amonth ago as of 11/12/  . Drug use: Not Currently    Types: Marijuana  . Sexual activity: Not on file

## 2020-09-03 ENCOUNTER — Other Ambulatory Visit: Payer: Self-pay | Admitting: *Deleted

## 2020-09-03 DIAGNOSIS — I739 Peripheral vascular disease, unspecified: Secondary | ICD-10-CM

## 2020-09-13 ENCOUNTER — Encounter (HOSPITAL_COMMUNITY): Payer: Self-pay

## 2020-09-13 ENCOUNTER — Ambulatory Visit: Payer: Self-pay | Admitting: Physician Assistant

## 2020-09-17 ENCOUNTER — Ambulatory Visit (INDEPENDENT_AMBULATORY_CARE_PROVIDER_SITE_OTHER): Payer: Self-pay | Admitting: Physician Assistant

## 2020-09-17 ENCOUNTER — Other Ambulatory Visit: Payer: Self-pay | Admitting: Physician Assistant

## 2020-09-17 ENCOUNTER — Other Ambulatory Visit: Payer: Self-pay

## 2020-09-17 ENCOUNTER — Encounter: Payer: Self-pay | Admitting: Physician Assistant

## 2020-09-17 VITALS — Ht 69.0 in | Wt 183.0 lb

## 2020-09-17 DIAGNOSIS — M86171 Other acute osteomyelitis, right ankle and foot: Secondary | ICD-10-CM

## 2020-09-17 MED ORDER — NITROGLYCERIN 0.2 MG/HR TD PT24: 0.2000 mg | MEDICATED_PATCH | Freq: Every day | TRANSDERMAL | 12 refills | Status: DC

## 2020-09-17 MED ORDER — DOXYCYCLINE HYCLATE 100 MG PO TABS: 100.0000 mg | ORAL_TABLET | Freq: Two times a day (BID) | ORAL | 0 refills | Status: DC

## 2020-09-17 MED ORDER — OXYCODONE-ACETAMINOPHEN 5-325 MG PO TABS: 1.0000 | ORAL_TABLET | ORAL | 0 refills | Status: DC | PRN

## 2020-09-17 MED FILL — NITROGLYCERIN 0.2 MG/HR PTC: 0.2 | 30 days supply | Qty: 30 | Fill #0

## 2020-09-17 MED FILL — DOXYCYCLINE HYCLATE 100 MG: 100 | 30 days supply | Qty: 60 | Fill #0

## 2020-09-17 NOTE — Progress Notes (Signed)
Office Visit Note   Patient: John Nguyen           Date of Birth: 1956/01/22           MRN: [id number] Visit Date: 09/17/2020              Requested by: No referring provider defined for this encounter. PCP: Pcp, No  Chief Complaint  Patient presents with  . Right Foot - Follow-up, Routine Post Op    Right transmetatarsal amputation 08/24/2020      HPI: Patient presents today 3 weeks status post right transmetatarsal amputation.  He has concerns because the wound is slow to heal.  He also recently bumped it which created a bit more bleeding.  He also is having some pain and would like a refill of his oxycodone  Assessment & Plan: Visit Diagnoses: No diagnosis found.  Plan: I discussed with the patient I am worried about healing.  He does have some areas of superficial necrosis.  I recommended that he try a nitroglycerin patch as well as possibly some Trental.  He does not want to take the Trental but is willing to try the patch.  I will also refill his pain medication.Daily cleansing and dressing changes  Follow-Up Instructions: No follow-ups on file.   Ortho Exam  Patient is alert, oriented, no adenopathy, well-dressed, normal affect, normal respiratory effort. Right foot he has biphasic pulses by Doppler.  He does have some mild erythema.  No foul odor.  Wound overall is slow to heal with areas of necrosis on the dorsal aspect.  Imaging: No results found. No images are attached to the encounter.  Labs: Lab Results  Component Value Date   HGBA1C 8.2 (H) 08/06/2020   REPTSTATUS 08/11/2020 FINAL 08/06/2020   REPTSTATUS 08/11/2020 FINAL 08/06/2020   CULT  08/06/2020    NO GROWTH 5 DAYS Performed at [facility], [address] [place], [place] [address]    CULT  08/06/2020    NO GROWTH 5 DAYS Performed at [facility], [address] [place], [place] [address]      Lab Results  Component Value Date   ALBUMIN 3.8 08/06/2020   ALBUMIN 3.8  08/20/2009    Lab Results  Component Value Date   MG 2.0 08/09/2020   No results found for: VD25OH  No results found for: PREALBUMIN CBC EXTENDED Latest Ref Rng & Units 08/24/2020 08/09/2020 08/07/2020  WBC 4.0 - 10.5 K/uL - 11.5(H) 10.8(H)  RBC 4.22 - 5.81 MIL/uL - 4.81 5.22  HGB 13.0 - 17.0 g/dL 82.9 56.2 13.0  HCT 39 - 52 % 50.0 44.1 48.4  PLT 150 - 400 K/uL - 309 344  NEUTROABS 1.7 - 7.7 K/uL - - -  LYMPHSABS 0.7 - 4.0 K/uL - - -     Body mass index is 27.02 kg/m.  Orders:  No orders of the defined types were placed in this encounter.  No orders of the defined types were placed in this encounter.    Procedures: No procedures performed  Clinical Data: No additional findings.  ROS:  All other systems negative, except as noted in the HPI. Review of Systems  Objective: Vital Signs: Ht 5\' 9"  (1.753 m)   Wt 183 lb (83 kg)   BMI 27.02 kg/m   Specialty Comments:  No specialty comments available.  PMFS History: Patient Active Problem List   Diagnosis Date Noted  . Osteomyelitis of foot, right, acute (HCC)   .  Gangrene of toe of right foot (HCC)   . PVD (peripheral vascular disease) (HCC)   . Cellulitis 08/06/2020  . Diabetes mellitus with peripheral vascular disease (HCC) 08/06/2020  . Psoriasis 08/06/2020  . Depression 08/06/2020   Past Medical History:  Diagnosis Date  . Anxiety   . Depression   . Diabetes mellitus without complication (HCC)   . Hypertension   . Psoriasis   . Type 1 diabetes mellitus (HCC)     Family History  Problem Relation Age of Onset  . Diabetes Mother   . Cancer Mother   . COPD Father     Past Surgical History:  Procedure Laterality Date  . ABDOMINAL AORTOGRAM W/LOWER EXTREMITY N/A 08/08/2020   Procedure: ABDOMINAL AORTOGRAM W/LOWER EXTREMITY;  Surgeon: Nada Libman, MD;  Location: [facility];  Service: Cardiovascular;  Laterality: N/A;  . AMPUTATION Right 08/24/2020   Procedure: RIGHT TRANSMETATARSAL  AMPUTATION;  Surgeon: Nadara Mustard, MD;  Location: [facility] OR;  Service: Orthopedics;  Laterality: Right;  . arm fracture surgery Left   . PERIPHERAL VASCULAR ATHERECTOMY  08/08/2020   Procedure: PERIPHERAL VASCULAR ATHERECTOMY;  Surgeon: Nada Libman, MD;  Location: [facility];  Service: Cardiovascular;;  . PERIPHERAL VASCULAR INTERVENTION  08/08/2020   Procedure: PERIPHERAL VASCULAR INTERVENTION;  Surgeon: Nada Libman, MD;  Location: [facility];  Service: Cardiovascular;;   Social History   Occupational History  . Not on file  Tobacco Use  . Smoking status: Never Smoker  . Smokeless tobacco: Never Used  Vaping Use  . Vaping Use: Former  Substance and Sexual Activity  . Alcohol use: Not Currently    Comment: stopped drinking about amonth ago as of 11/12/  . Drug use: Not Currently    Types: Marijuana  . Sexual activity: Not on file

## 2020-09-20 ENCOUNTER — Other Ambulatory Visit: Payer: Self-pay

## 2020-09-20 ENCOUNTER — Ambulatory Visit (HOSPITAL_COMMUNITY)
Admission: RE | Admit: 2020-09-20 | Discharge: 2020-09-20 | Disposition: A | Discharge status: Home / Self Care | Payer: Self-pay | Source: Ambulatory Visit | Attending: Surgery | Admitting: Surgery

## 2020-09-20 ENCOUNTER — Ambulatory Visit (INDEPENDENT_AMBULATORY_CARE_PROVIDER_SITE_OTHER): Payer: Self-pay | Admitting: Physician Assistant

## 2020-09-20 ENCOUNTER — Ambulatory Visit (INDEPENDENT_AMBULATORY_CARE_PROVIDER_SITE_OTHER)
Admission: RE | Admit: 2020-09-20 | Discharge: 2020-09-20 | Disposition: A | Discharge status: Home / Self Care | Payer: Self-pay | Source: Ambulatory Visit | Attending: Surgery | Admitting: Surgery

## 2020-09-20 VITALS — BP 131/70 | HR 73 | Temp 98.5°F | Resp 20 | Ht 69.0 in | Wt 182.0 lb

## 2020-09-20 DIAGNOSIS — I739 Peripheral vascular disease, unspecified: Secondary | ICD-10-CM

## 2020-09-20 NOTE — Progress Notes (Signed)
Postoperative Visit ([place])   History of Present Illness   John Nguyen is a 64 y.o. male who presents s/p atherectomy of right popliteal TP trunk and peroneal artery with stenting of his popliteal artery by Dr. Myra Gianotti on 08/08/2020.  This was done due to ulceration on his right foot.  Subsequently he underwent right transmetatarsal amputation by Dr. Lajoyce Corners on 08/24/2020.  He states wound has progressively been looking worse since surgery.  He denies any fevers, chills, nausea/vomiting.   Current Outpatient Medications  Medication Sig Dispense Refill  . aspirin EC 81 MG EC tablet Take 1 tablet (81 mg total) by mouth daily. Swallow whole. 30 tablet 11  . atorvastatin (LIPITOR) 80 MG tablet Take 1 tablet (80 mg total) by mouth daily. 30 tablet 3  . clopidogrel (PLAVIX) 75 MG tablet Take 1 tablet (75 mg total) by mouth daily with breakfast. 30 tablet 3  . diphenhydrAMINE (BENADRYL) 25 mg capsule Take 25 mg by mouth every 6 (six) hours as needed for itching or allergies.    Marland Kitchen doxycycline (VIBRA-TABS) 100 MG tablet Take 1 tablet (100 mg total) by mouth 2 (two) times daily. 60 tablet 0  . gabapentin (NEURONTIN) 100 MG capsule Take 1 capsule (100 mg total) by mouth 3 (three) times daily. 90 capsule 0  . insulin NPH Human (NOVOLIN N) 100 UNIT/ML injection Inject 0.12 mLs (12 Units total) into the skin 2 (two) times daily. (Patient taking differently: Inject 35 Units into the skin daily before breakfast.) 10 mL 11  . lisinopril (ZESTRIL) 5 MG tablet Take 1 tablet (5 mg total) by mouth daily. 90 tablet 3  . Multiple Vitamin (MULTIVITAMIN WITH MINERALS) TABS tablet Take 1 tablet by mouth daily.    . nitroGLYCERIN (NITRODUR - DOSED IN MG/24 HR) 0.2 mg/hr patch Place 1 patch (0.2 mg total) onto the skin daily. 30 patch 12  . oxyCODONE-acetaminophen (PERCOCET) 5-325 MG tablet Take 1 tablet by mouth every 4 (four) hours as needed. 42 tablet 0   No current facility-administered medications for this visit.     ROS: 10 system ROS negative unless otherwise noted   For VQI Use Only   PRE-ADM LIVING: Home  AMB STATUS: Wheelchair   Physical Examination   Vitals:   09/20/20 1606  BP: 131/70  Pulse: 73  Resp: 20  Temp: 98.5 F (36.9 C)  TempSrc: Temporal  SpO2: 100%  Weight: 182 lb (82.6 kg)  Height: 5\' 9"  (1.753 m)   Body mass index is 26.88 kg/m.  General Alert, O x 3, WD, NAD  Pulmonary Sym exp, good B air movt,   Cardiac RRR, Nl S1, S2,   Vascular Vessel Right Left  PT Not palpable Not palpable  DP Not palpable Not palpable    Gastrointestinal soft, non-distended, non-tender to palpation,   Musculoskeletal M/S 5/5 throughout  , Extremities without ischemic changes  , No edema present,  Neurologic  Pain and light touch intact in extremities , Motor exam as listed above        Medical Decision Making   John Nguyen is a 64 y.o. male who presents s/p R atherectomy of popliteal, TP trunk, and peroneal artery with stenting of popliteal artery with subsequent TMA.   Based on arterial duplex in the office today, popliteal stent is widely patent and patient has three-vessel runoff to the level of the ankle  Unfortunately TMA is nonhealing despite duplex findings  I discussed the patient with Dr. 68 who recommended patient present  to the emergency department for hospital admission today with TMA revision/debridement tomorrow.  The patient has no interest in this option.  He was explained that delaying treatment puts him at higher risk for limb loss  Patient will continue his doxycycline prescription and will follow up with Dr. Lajoyce Corners in office on Monday.  From a vascular standpoint we will reimage popliteal stent in 6 months per protocol.  He will continue his aspirin and Plavix daily  Emilie Rutter PA-C [facility] Office: [phone]  Clinic MD: Darrick Penna

## 2020-09-21 ENCOUNTER — Other Ambulatory Visit: Payer: Self-pay

## 2020-09-21 DIAGNOSIS — I739 Peripheral vascular disease, unspecified: Secondary | ICD-10-CM

## 2020-09-24 ENCOUNTER — Ambulatory Visit (INDEPENDENT_AMBULATORY_CARE_PROVIDER_SITE_OTHER): Payer: Self-pay | Admitting: Orthopedic Surgery

## 2020-09-24 ENCOUNTER — Other Ambulatory Visit: Payer: Self-pay | Admitting: Physician Assistant

## 2020-09-24 ENCOUNTER — Encounter: Payer: Self-pay | Admitting: Physician Assistant

## 2020-09-24 VITALS — Ht 69.0 in | Wt 182.0 lb

## 2020-09-24 DIAGNOSIS — Z89431 Acquired absence of right foot: Secondary | ICD-10-CM

## 2020-09-24 DIAGNOSIS — I96 Gangrene, not elsewhere classified: Secondary | ICD-10-CM

## 2020-09-24 MED ORDER — OXYCODONE-ACETAMINOPHEN 5-325 MG PO TABS: 1.0000 | ORAL_TABLET | ORAL | 0 refills | Status: DC | PRN

## 2020-09-24 NOTE — Progress Notes (Signed)
Office Visit Note   Patient: John Nguyen           Date of Birth: [birth year]           MRN: [id number] Visit Date: 09/24/2020              Requested by: No referring provider defined for this encounter. PCP: Pcp, No  Chief Complaint  Patient presents with  . Right Foot - Routine Post Op    08/24/20 right foot transmet amputation       HPI: Patient is a 64 year old gentleman who is status post revascularization to the right lower extremity and subsequent transmetatarsal amputation. Patient states he has extreme pain swelling and progressive gangrenous changes. Patient states he has not started the nitroglycerin patch. Patient states the pain is extreme and he wants to consider an amputation.  Assessment & Plan: Visit Diagnoses:  1. S/P transmetatarsal amputation of foot, right (HCC)   2. Gangrene of right foot (HCC)     Plan: Discussed options including continued conservative treatment with this being a prolonged course with possible revision surgery and the possibility of the wound not healing. Also discussed the options of transtibial amputation and the postoperative course for a below the knee prosthesis. Patient states that he is not willing to put up with this extreme pain he wants to proceed with surgery on Wednesday we will set this up at his convenience. Patient may need inpatient versus outpatient rehabilitation.  Follow-Up Instructions: Return in about 2 weeks (around 10/08/2020).   Ortho Exam  Patient is alert, oriented, no adenopathy, well-dressed, normal affect, normal respiratory effort. Examination patient has black gangrenous changes approximately 3 cm in diameter across the residual limb. There is no purulent drainage no ascending cellulitis.  Imaging: No results found. No images are attached to the encounter.  Labs: Lab Results  Component Value Date   HGBA1C 8.2 (H) 08/06/2020   REPTSTATUS 08/11/2020 FINAL 08/06/2020   REPTSTATUS 08/11/2020 FINAL  08/06/2020   CULT  08/06/2020    NO GROWTH 5 DAYS Performed at [facility], [address] [place], [place] [address]    CULT  08/06/2020    NO GROWTH 5 DAYS Performed at [facility], [address], [place], [place] [address]      Lab Results  Component Value Date   ALBUMIN 3.8 08/06/2020   ALBUMIN 3.8 08/20/2009    Lab Results  Component Value Date   MG 2.0 08/09/2020   No results found for: VD25OH  No results found for: PREALBUMIN CBC EXTENDED Latest Ref Rng & Units 08/24/2020 08/09/2020 08/07/2020  WBC 4.0 - 10.5 K/uL - 11.5(H) 10.8(H)  RBC 4.22 - 5.81 MIL/uL - 4.81 5.22  HGB 13.0 - 17.0 g/dL 93.8 10.1 75.1  HCT 02.5 - 52.0 % 50.0 44.1 48.4  PLT 150 - 400 K/uL - 309 344  NEUTROABS 1.7 - 7.7 K/uL - - -  LYMPHSABS 0.7 - 4.0 K/uL - - -     Body mass index is 26.88 kg/m.  Orders:  No orders of the defined types were placed in this encounter.  No orders of the defined types were placed in this encounter.    Procedures: No procedures performed  Clinical Data: No additional findings.  ROS:  All other systems negative, except as noted in the HPI. Review of Systems  Objective: Vital Signs: Ht 5\' 9"  (1.753 m)   Wt 182 lb (82.6 kg)   BMI 26.88 kg/m  Specialty Comments:  No specialty comments available.  PMFS History: Patient Active Problem List   Diagnosis Date Noted  . Osteomyelitis of foot, right, acute (HCC)   . Gangrene of toe of right foot (HCC)   . PVD (peripheral vascular disease) (HCC)   . Cellulitis 08/06/2020  . Diabetes mellitus with peripheral vascular disease (HCC) 08/06/2020  . Psoriasis 08/06/2020  . Depression 08/06/2020   Past Medical History:  Diagnosis Date  . Anxiety   . Depression   . Diabetes mellitus without complication (HCC)   . Hypertension   . Psoriasis   . Type 1 diabetes mellitus (HCC)     Family History  Problem Relation Age of Onset  . Diabetes Mother   . Cancer Mother   . COPD Father      Past Surgical History:  Procedure Laterality Date  . ABDOMINAL AORTOGRAM W/LOWER EXTREMITY N/A 08/08/2020   Procedure: ABDOMINAL AORTOGRAM W/LOWER EXTREMITY;  Surgeon: Nada Libman, MD;  Location: [facility];  Service: Cardiovascular;  Laterality: N/A;  . AMPUTATION Right 08/24/2020   Procedure: RIGHT TRANSMETATARSAL AMPUTATION;  Surgeon: Nadara Mustard, MD;  Location: [facility] OR;  Service: Orthopedics;  Laterality: Right;  . arm fracture surgery Left   . PERIPHERAL VASCULAR ATHERECTOMY  08/08/2020   Procedure: PERIPHERAL VASCULAR ATHERECTOMY;  Surgeon: Nada Libman, MD;  Location: [facility];  Service: Cardiovascular;;  . PERIPHERAL VASCULAR INTERVENTION  08/08/2020   Procedure: PERIPHERAL VASCULAR INTERVENTION;  Surgeon: Nada Libman, MD;  Location: [facility];  Service: Cardiovascular;;   Social History   Occupational History  . Not on file  Tobacco Use  . Smoking status: Never Smoker  . Smokeless tobacco: Never Used  Vaping Use  . Vaping Use: Former  Substance and Sexual Activity  . Alcohol use: Not Currently    Comment: stopped drinking about amonth ago as of 11/12/  . Drug use: Not Currently    Types: Marijuana  . Sexual activity: Not on file

## 2020-09-25 ENCOUNTER — Encounter (HOSPITAL_COMMUNITY): Payer: Self-pay | Admitting: Orthopedic Surgery

## 2020-09-25 ENCOUNTER — Telehealth: Payer: Self-pay | Admitting: Orthopedic Surgery

## 2020-09-25 NOTE — Telephone Encounter (Signed)
I called and sw pt and he advised that he already took his Plavix for today I advised to hold tomorrow. I asked if he had been advised what meds to hold by the hospital prior to his surgery and he advised yes that he has been instructed on what to do. I advised to follow their instruction.

## 2020-09-25 NOTE — Progress Notes (Signed)
PCP:  Denies Cardiologist:  Denies  EKG:  N/A CXR:  N/A ECHO:  Denies Stress Test:  Denies Cardiac Cath:  Denies  Fasting Blood Sugar- Checks Blood Sugar___ times a day  OSA/CPAP:  No  ASA/Blood Thinners:  Instructed patient to call Dr. Audrie Lia office today for instructions.  Patient takes for stents in his legs.   Covid test DOS  Anesthesia Review:  No  Patient denies shortness of breath, fever, cough, and chest pain at PAT appointment.  Patient verbalized understanding of instructions provided today at the PAT appointment.  Patient asked to review instructions at home and day of surgery.

## 2020-09-25 NOTE — Telephone Encounter (Signed)
Pt called stating he is having surgery tomorrow morning and was wanting to know if he should take his plavix before the surgery? He would like a CB with answer please  [phone]

## 2020-09-26 ENCOUNTER — Other Ambulatory Visit: Payer: Self-pay

## 2020-09-26 ENCOUNTER — Encounter (HOSPITAL_COMMUNITY)
Admission: RE | Disposition: A | Discharge status: Home Health | Payer: Self-pay | Source: Home / Self Care | Attending: Orthopedic Surgery

## 2020-09-26 ENCOUNTER — Inpatient Hospital Stay (HOSPITAL_COMMUNITY): Payer: Self-pay | Admitting: Certified Registered"

## 2020-09-26 ENCOUNTER — Inpatient Hospital Stay (HOSPITAL_COMMUNITY)
Admission: RE | Admit: 2020-09-26 | Discharge: 2020-10-04 | DRG: 240 | Disposition: A | Discharge status: Home Health | Payer: Self-pay | Attending: Orthopedic Surgery | Admitting: Orthopedic Surgery

## 2020-09-26 ENCOUNTER — Encounter (HOSPITAL_COMMUNITY): Payer: Self-pay | Admitting: Orthopedic Surgery

## 2020-09-26 DIAGNOSIS — I96 Gangrene, not elsewhere classified: Secondary | ICD-10-CM

## 2020-09-26 DIAGNOSIS — M869 Osteomyelitis, unspecified: Secondary | ICD-10-CM | POA: Diagnosis present

## 2020-09-26 DIAGNOSIS — E1052 Type 1 diabetes mellitus with diabetic peripheral angiopathy with gangrene: Principal | ICD-10-CM | POA: Diagnosis present

## 2020-09-26 DIAGNOSIS — M86171 Other acute osteomyelitis, right ankle and foot: Secondary | ICD-10-CM | POA: Diagnosis present

## 2020-09-26 DIAGNOSIS — E1069 Type 1 diabetes mellitus with other specified complication: Secondary | ICD-10-CM | POA: Diagnosis present

## 2020-09-26 DIAGNOSIS — I10 Essential (primary) hypertension: Secondary | ICD-10-CM | POA: Diagnosis present

## 2020-09-26 DIAGNOSIS — Z888 Allergy status to other drugs, medicaments and biological substances status: Secondary | ICD-10-CM

## 2020-09-26 DIAGNOSIS — Z833 Family history of diabetes mellitus: Secondary | ICD-10-CM

## 2020-09-26 DIAGNOSIS — Z20822 Contact with and (suspected) exposure to covid-19: Secondary | ICD-10-CM | POA: Diagnosis present

## 2020-09-26 DIAGNOSIS — Z794 Long term (current) use of insulin: Secondary | ICD-10-CM

## 2020-09-26 DIAGNOSIS — Z825 Family history of asthma and other chronic lower respiratory diseases: Secondary | ICD-10-CM

## 2020-09-26 HISTORY — PX: AMPUTATION: SHX166

## 2020-09-26 LAB — CBC
HCT: 41.3 % (ref 39.0–52.0)
Hemoglobin: 13.8 g/dL (ref 13.0–17.0)
MCH: 29.9 pg (ref 26.0–34.0)
MCHC: 33.4 g/dL (ref 30.0–36.0)
MCV: 89.6 fL (ref 80.0–100.0)
Platelets: 358 10*3/uL (ref 150–400)
RBC: 4.61 MIL/uL (ref 4.22–5.81)
RDW: 14 % (ref 11.5–15.5)
WBC: 9.5 10*3/uL (ref 4.0–10.5)
nRBC: 0 % (ref 0.0–0.2)

## 2020-09-26 LAB — BASIC METABOLIC PANEL
Anion gap: 11 (ref 5–15)
BUN: 10 mg/dL (ref 8–23)
CO2: 22 mmol/L (ref 22–32)
Calcium: 9 mg/dL (ref 8.9–10.3)
Chloride: 103 mmol/L (ref 98–111)
Creatinine, Ser: 0.96 mg/dL (ref 0.61–1.24)
GFR, Estimated: >60 mL/min (ref >60)
Glucose, Bld: 189 mg/dL — ABNORMAL HIGH (ref 70–99)
Potassium: 4.3 mmol/L (ref 3.5–5.1)
Sodium: 136 mmol/L (ref 135–145)

## 2020-09-26 LAB — GLUCOSE, CAPILLARY
Glucose-Capillary: 171 mg/dL — ABNORMAL HIGH (ref 70–99)
Glucose-Capillary: 221 mg/dL — ABNORMAL HIGH (ref 70–99)
Glucose-Capillary: 291 mg/dL — ABNORMAL HIGH (ref 70–99)
Glucose-Capillary: 288 mg/dL — ABNORMAL HIGH (ref 70–99)
Glucose-Capillary: 243 mg/dL — ABNORMAL HIGH (ref 70–99)

## 2020-09-26 LAB — SARS CORONAVIRUS 2 BY RT PCR (HOSPITAL ORDER, PERFORMED IN ~~LOC~~ HOSPITAL LAB): SARS Coronavirus 2: NEGATIVE

## 2020-09-26 SURGERY — AMPUTATION BELOW KNEE
Anesthesia: Monitor Anesthesia Care | Site: Knee | Laterality: Right

## 2020-09-26 MED ORDER — ACETAMINOPHEN 500 MG PO TABS
1000.0000 mg | ORAL_TABLET | Freq: Once | ORAL | Status: AC
  Administered 2020-09-26: 10:00:00 1000 mg via ORAL
  Filled 2020-09-26: qty 2

## 2020-09-26 MED ORDER — INSULIN NPH (HUMAN) (ISOPHANE) 100 UNIT/ML ~~LOC~~ SUSP
9.0000 [IU] | Freq: Two times a day (BID) | SUBCUTANEOUS | Status: DC
  Administered 2020-09-26 – 2020-10-04 (×16): 9 [IU] via SUBCUTANEOUS
  Filled 2020-09-26: qty 10

## 2020-09-26 MED ORDER — CEFAZOLIN SODIUM-DEXTROSE 2-4 GM/100ML-% IV SOLN
2.0000 g | Freq: Four times a day (QID) | INTRAVENOUS | Status: AC
  Administered 2020-09-26 – 2020-09-27 (×3): 2 g via INTRAVENOUS
  Filled 2020-09-26 (×3): qty 100

## 2020-09-26 MED ORDER — 0.9 % SODIUM CHLORIDE (POUR BTL) OPTIME
TOPICAL | Status: DC | PRN
  Administered 2020-09-26: 12:00:00 1000 mL

## 2020-09-26 MED ORDER — ACETAMINOPHEN 325 MG PO TABS
325.0000 mg | ORAL_TABLET | Freq: Four times a day (QID) | ORAL | Status: DC | PRN
  Administered 2020-09-27 – 2020-10-04 (×7): 650 mg via ORAL
  Filled 2020-09-26 (×7): qty 2

## 2020-09-26 MED ORDER — DEXMEDETOMIDINE (PRECEDEX) IN NS 20 MCG/5ML (4 MCG/ML) IV SYRINGE
PREFILLED_SYRINGE | INTRAVENOUS | Status: DC | PRN
  Administered 2020-09-26 (×5): 4 ug via INTRAVENOUS

## 2020-09-26 MED ORDER — PROPOFOL 500 MG/50ML IV EMUL
INTRAVENOUS | Status: DC | PRN
  Administered 2020-09-26: 100 ug/kg/min via INTRAVENOUS

## 2020-09-26 MED ORDER — HYDROMORPHONE HCL 1 MG/ML IJ SOLN: 0.2500 mg | INTRAMUSCULAR | Status: DC | PRN

## 2020-09-26 MED ORDER — MIDAZOLAM HCL 2 MG/2ML IJ SOLN
INTRAMUSCULAR | Status: AC
  Administered 2020-09-26: 11:00:00 2 mg via INTRAVENOUS
  Filled 2020-09-26: qty 2

## 2020-09-26 MED ORDER — ONDANSETRON HCL 4 MG PO TABS: 4.0000 mg | ORAL_TABLET | Freq: Four times a day (QID) | ORAL | Status: DC | PRN

## 2020-09-26 MED ORDER — PHENYLEPHRINE 40 MCG/ML (10ML) SYRINGE FOR IV PUSH (FOR BLOOD PRESSURE SUPPORT)
PREFILLED_SYRINGE | INTRAVENOUS | Status: AC
  Filled 2020-09-26: qty 10

## 2020-09-26 MED ORDER — SODIUM CHLORIDE 0.9 % IV SOLN: INTRAVENOUS | Status: DC

## 2020-09-26 MED ORDER — METOCLOPRAMIDE HCL 5 MG PO TABS
5.0000 mg | ORAL_TABLET | Freq: Three times a day (TID) | ORAL | Status: DC | PRN
Start: 2020-09-26 — End: 2020-10-04

## 2020-09-26 MED ORDER — DOCUSATE SODIUM 100 MG PO CAPS
100.0000 mg | ORAL_CAPSULE | Freq: Two times a day (BID) | ORAL | Status: DC
  Filled 2020-09-26: qty 1

## 2020-09-26 MED ORDER — CEFAZOLIN SODIUM-DEXTROSE 2-4 GM/100ML-% IV SOLN
2.0000 g | INTRAVENOUS | Status: AC
  Administered 2020-09-26: 11:00:00 2 g via INTRAVENOUS
  Filled 2020-09-26: qty 100

## 2020-09-26 MED ORDER — HYDROMORPHONE HCL 1 MG/ML IJ SOLN: 0.5000 mg | INTRAMUSCULAR | Status: DC | PRN

## 2020-09-26 MED ORDER — DEXMEDETOMIDINE (PRECEDEX) IN NS 20 MCG/5ML (4 MCG/ML) IV SYRINGE
PREFILLED_SYRINGE | INTRAVENOUS | Status: AC
  Filled 2020-09-26: qty 5

## 2020-09-26 MED ORDER — OXYCODONE HCL 5 MG PO TABS
5.0000 mg | ORAL_TABLET | ORAL | Status: DC | PRN
  Administered 2020-09-27: 10 mg via ORAL
  Administered 2020-09-27: 5 mg via ORAL
  Administered 2020-09-27 – 2020-10-04 (×27): 10 mg via ORAL
  Filled 2020-09-26 (×14): qty 2
  Filled 2020-09-26: qty 1
  Filled 2020-09-26 (×14): qty 2

## 2020-09-26 MED ORDER — LIDOCAINE 2% (20 MG/ML) 5 ML SYRINGE
INTRAMUSCULAR | Status: AC
  Filled 2020-09-26: qty 5

## 2020-09-26 MED ORDER — INSULIN ASPART 100 UNIT/ML ~~LOC~~ SOLN
0.0000 [IU] | Freq: Three times a day (TID) | SUBCUTANEOUS | Status: DC
  Administered 2020-09-27: 09:00:00 3 [IU] via SUBCUTANEOUS
  Administered 2020-09-27: 13:00:00 2 [IU] via SUBCUTANEOUS
  Administered 2020-09-28: 17:00:00 3 [IU] via SUBCUTANEOUS
  Administered 2020-09-28 (×2): 5 [IU] via SUBCUTANEOUS
  Administered 2020-09-29: 17:00:00 3 [IU] via SUBCUTANEOUS
  Administered 2020-09-29 – 2020-09-30 (×3): 5 [IU] via SUBCUTANEOUS
  Administered 2020-09-30: 17:00:00 8 [IU] via SUBCUTANEOUS
  Administered 2020-09-30 – 2020-10-01 (×3): 3 [IU] via SUBCUTANEOUS
  Administered 2020-10-02 (×3): 5 [IU] via SUBCUTANEOUS
  Administered 2020-10-03 – 2020-10-04 (×3): 3 [IU] via SUBCUTANEOUS

## 2020-09-26 MED ORDER — CELECOXIB 200 MG PO CAPS
200.0000 mg | ORAL_CAPSULE | Freq: Once | ORAL | Status: AC
  Administered 2020-09-26: 10:00:00 200 mg via ORAL
  Filled 2020-09-26: qty 1

## 2020-09-26 MED ORDER — BUPIVACAINE-EPINEPHRINE (PF) 0.5% -1:200000 IJ SOLN
INTRAMUSCULAR | Status: DC | PRN
  Administered 2020-09-26: 15 mL via PERINEURAL
  Administered 2020-09-26: 20 mL via PERINEURAL

## 2020-09-26 MED ORDER — FENTANYL CITRATE (PF) 100 MCG/2ML IJ SOLN
INTRAMUSCULAR | Status: AC
  Administered 2020-09-26: 11:00:00 100 ug via INTRAVENOUS
  Filled 2020-09-26: qty 2

## 2020-09-26 MED ORDER — LACTATED RINGERS IV SOLN: INTRAVENOUS | Status: DC

## 2020-09-26 MED ORDER — FENTANYL CITRATE (PF) 100 MCG/2ML IJ SOLN: 100.0000 ug | Freq: Once | INTRAMUSCULAR | Status: AC

## 2020-09-26 MED ORDER — CHLORHEXIDINE GLUCONATE 0.12 % MT SOLN
15.0000 mL | Freq: Once | OROMUCOSAL | Status: AC
  Administered 2020-09-26: 10:00:00 15 mL via OROMUCOSAL
  Filled 2020-09-26: qty 15

## 2020-09-26 MED ORDER — PHENYLEPHRINE 40 MCG/ML (10ML) SYRINGE FOR IV PUSH (FOR BLOOD PRESSURE SUPPORT)
PREFILLED_SYRINGE | INTRAVENOUS | Status: DC | PRN
  Administered 2020-09-26 (×4): 80 ug via INTRAVENOUS

## 2020-09-26 MED ORDER — PROPOFOL 10 MG/ML IV BOLUS
INTRAVENOUS | Status: DC | PRN
  Administered 2020-09-26: 20 mg via INTRAVENOUS
  Administered 2020-09-26: 30 mg via INTRAVENOUS

## 2020-09-26 MED ORDER — ONDANSETRON HCL 4 MG/2ML IJ SOLN: 4.0000 mg | Freq: Four times a day (QID) | INTRAMUSCULAR | Status: DC | PRN

## 2020-09-26 MED ORDER — METOCLOPRAMIDE HCL 5 MG/ML IJ SOLN: 5.0000 mg | Freq: Three times a day (TID) | INTRAMUSCULAR | Status: DC | PRN

## 2020-09-26 MED ORDER — INSULIN NPH (HUMAN) (ISOPHANE) 100 UNIT/ML ~~LOC~~ SUSP
9.0000 [IU] | Freq: Two times a day (BID) | SUBCUTANEOUS | Status: DC
  Filled 2020-09-26: qty 10

## 2020-09-26 MED ORDER — ORAL CARE MOUTH RINSE: 15.0000 mL | Freq: Once | OROMUCOSAL | Status: AC

## 2020-09-26 MED ORDER — BUPIVACAINE LIPOSOME 1.3 % IJ SUSP
INTRAMUSCULAR | Status: DC | PRN
  Administered 2020-09-26: 10 mL via PERINEURAL

## 2020-09-26 MED ORDER — MIDAZOLAM HCL 2 MG/2ML IJ SOLN: 2.0000 mg | Freq: Once | INTRAMUSCULAR | Status: AC

## 2020-09-26 MED ORDER — ASPIRIN EC 81 MG PO TBEC
81.0000 mg | DELAYED_RELEASE_TABLET | Freq: Every day | ORAL | Status: DC
  Administered 2020-09-27 – 2020-10-04 (×8): 81 mg via ORAL
  Filled 2020-09-26 (×8): qty 1

## 2020-09-26 MED ORDER — CLOPIDOGREL BISULFATE 75 MG PO TABS
75.0000 mg | ORAL_TABLET | Freq: Every day | ORAL | Status: DC
  Administered 2020-09-27 – 2020-10-04 (×8): 75 mg via ORAL
  Filled 2020-09-26 (×8): qty 1

## 2020-09-26 MED ORDER — PROPOFOL 10 MG/ML IV BOLUS
INTRAVENOUS | Status: AC
  Filled 2020-09-26: qty 20

## 2020-09-26 MED ORDER — ATORVASTATIN CALCIUM 80 MG PO TABS
80.0000 mg | ORAL_TABLET | Freq: Every day | ORAL | Status: DC
  Filled 2020-09-26 (×7): qty 1

## 2020-09-26 SURGICAL SUPPLY — 38 items
BLADE SAW RECIP 87.9 MT (BLADE) ×2 IMPLANT
BLADE SURG 21 STRL SS (BLADE) ×2 IMPLANT
BNDG COHESIVE 6X5 TAN STRL LF (GAUZE/BANDAGES/DRESSINGS) IMPLANT
CANISTER WOUND CARE 500ML ATS (WOUND CARE) ×2 IMPLANT
CANISTER WOUNDNEG PRESSURE 500 (CANNISTER) ×1 IMPLANT
COVER SURGICAL LIGHT HANDLE (MISCELLANEOUS) ×2 IMPLANT
CUFF TOURN SGL QUICK 34 (TOURNIQUET CUFF) ×2
CUFF TRNQT CYL 34X4.125X (TOURNIQUET CUFF) ×1 IMPLANT
DRAPE DERMATAC (DRAPES) ×1 IMPLANT
DRAPE INCISE IOBAN 66X45 STRL (DRAPES) ×2 IMPLANT
DRAPE U-SHAPE 47X51 STRL (DRAPES) ×2 IMPLANT
DRESSING PREVENA PLUS CUSTOM (GAUZE/BANDAGES/DRESSINGS) ×1 IMPLANT
DRSG PREVENA PLUS CUSTOM (GAUZE/BANDAGES/DRESSINGS) ×2
DURAPREP 26ML APPLICATOR (WOUND CARE) ×2 IMPLANT
ELECT REM PT RETURN 9FT ADLT (ELECTROSURGICAL) ×2
ELECTRODE REM PT RTRN 9FT ADLT (ELECTROSURGICAL) ×1 IMPLANT
GLOVE BIOGEL PI IND STRL 9 (GLOVE) ×1 IMPLANT
GLOVE BIOGEL PI INDICATOR 9 (GLOVE) ×1
GLOVE SURG ORTHO 9.0 STRL STRW (GLOVE) ×2 IMPLANT
GOWN STRL REUS W/ TWL XL LVL3 (GOWN DISPOSABLE) ×2 IMPLANT
GOWN STRL REUS W/TWL XL LVL3 (GOWN DISPOSABLE) ×4
KIT BASIN OR (CUSTOM PROCEDURE TRAY) ×2 IMPLANT
KIT TURNOVER KIT B (KITS) ×2 IMPLANT
MANIFOLD NEPTUNE II (INSTRUMENTS) ×2 IMPLANT
NS IRRIG 1000ML POUR BTL (IV SOLUTION) ×2 IMPLANT
PACK ORTHO EXTREMITY (CUSTOM PROCEDURE TRAY) ×2 IMPLANT
PAD ARMBOARD 7.5X6 YLW CONV (MISCELLANEOUS) ×2 IMPLANT
PREVENA RESTOR ARTHOFORM 46X30 (CANNISTER) ×2 IMPLANT
SPONGE LAP 18X18 RF (DISPOSABLE) IMPLANT
STAPLER VISISTAT 35W (STAPLE) IMPLANT
STOCKINETTE IMPERVIOUS LG (DRAPES) ×2 IMPLANT
SUT ETHILON 2 0 PSLX (SUTURE) IMPLANT
SUT SILK 2 0 (SUTURE) ×2
SUT SILK 2-0 18XBRD TIE 12 (SUTURE) ×1 IMPLANT
SUT VIC AB 1 CTX 27 (SUTURE) ×4 IMPLANT
TOWEL GREEN STERILE (TOWEL DISPOSABLE) ×2 IMPLANT
TUBE CONNECTING 12X1/4 (SUCTIONS) ×2 IMPLANT
YANKAUER SUCT BULB TIP NO VENT (SUCTIONS) ×2 IMPLANT

## 2020-09-26 NOTE — Anesthesia Postprocedure Evaluation (Signed)
Anesthesia Post Note  Patient: John Nguyen  Procedure(s) Performed: RIGHT AMPUTATION BELOW KNEE (Right Knee)     Patient location during evaluation: PACU Anesthesia Type: Regional and MAC Level of consciousness: awake and alert Pain management: pain level controlled Vital Signs Assessment: post-procedure vital signs reviewed and stable Respiratory status: spontaneous breathing, nonlabored ventilation and respiratory function stable Cardiovascular status: stable and blood pressure returned to baseline Postop Assessment: no apparent nausea or vomiting Anesthetic complications: no   No complications documented.  Last Vitals:  Vitals:   09/26/20 1237 09/26/20 1255  BP: 109/63 (!) 107/56  Pulse: 81 77  Resp: 12 15  Temp:    SpO2: 100% 99%    Last Pain:  Vitals:   09/26/20 1222  TempSrc:   PainSc: 0-No pain                 Lauralynn Loeb,W. EDMOND

## 2020-09-26 NOTE — Plan of Care (Signed)

## 2020-09-26 NOTE — Op Note (Signed)
   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  PROCEDURE: Transtibial amputation Application of Prevena wound VAC  SURGEON: Lajoyce Corners, M.D.  ANESTHESIA:  general  IV FLUIDS AND URINE: See anesthesia.  ESTIMATED BLOOD LOSS: Minimal mL.  COMPLICATIONS: None.  DESCRIPTION OF PROCEDURE: The patient was brought to the operating room and underwent a general anesthetic. After adequate levels of anesthesia were obtained patient's lower extremity was prepped using DuraPrep draped into a sterile field. A timeout was called. The foot was draped out of the sterile field with impervious stockinette. A transverse incision was made 11 cm distal to the tibial tubercle. This curved proximally and a large posterior flap was created. The tibia was transected 1 cm proximal to the skin incision. The fibula was transected just proximal to the tibial incision. The tibia was beveled anteriorly. A large posterior flap was created. The sciatic nerve was pulled cut and allowed to retract. The vascular bundles were suture ligated with 2-0 silk. The deep and superficial fascial layers were closed using #1 Vicryl. The skin was closed using staples and 2-0 nylon. The wound was covered with a Prevena wound VAC. There was a good suction fit. A prosthetic shrinker was applied. Patient was extubated taken to the PACU in stable condition.   DISCHARGE  PLANNING:  Antibiotic duration: 24 hours antibiotics  Weightbearing: Nonweightbearing on the right  Pain medication: Opioid pathway  Dressing care/ Wound VAC: Continue wound VAC for 1 week  Discharge to: Discharge to inpatient versus outpatient rehab  Follow-up: In the office 1 week post operative.  [name], MD [facility] 12:02 PM

## 2020-09-26 NOTE — Progress Notes (Signed)
Orthopedic Tech Progress Note Patient Details:  John Nguyen 05/15/1956 [id number] Called [employer] to order shrinker. Patient ID: John Nguyen, male   DOB: 11-03-1955, 64 y.o.   MRN: [id number]   Kerry Fort 09/26/2020, 12:41 PM

## 2020-09-26 NOTE — Transfer of Care (Signed)
Immediate Anesthesia Transfer of Care Note  Patient: John Nguyen  Procedure(s) Performed: RIGHT AMPUTATION BELOW KNEE (Right Knee)  Patient Location: PACU  Anesthesia Type:MAC and Regional  Level of Consciousness: awake, alert  and oriented  Airway & Oxygen Therapy: Patient Spontanous Breathing and Patient connected to face mask oxygen  Post-op Assessment: Report given to RN and Post -op Vital signs reviewed and stable  Post vital signs: Reviewed and stable  Last Vitals:  Vitals Value Taken Time  BP 108/60 09/26/20 1153  Temp    Pulse 87 09/26/20 1154  Resp 14 09/26/20 1154  SpO2 100 % 09/26/20 1154  Vitals shown include unvalidated device data.  Last Pain:  Vitals:   09/26/20 0919  TempSrc: Oral  PainSc: 8       Patients Stated Pain Goal: 4 (37/16/96 7893)  Complications: No complications documented.

## 2020-09-26 NOTE — Plan of Care (Signed)

## 2020-09-26 NOTE — Progress Notes (Signed)
Inpatient Diabetes Program Recommendations  AACE/ADA: New Consensus Statement on Inpatient Glycemic Control (2015)  Target Ranges:  Prepandial:   less than 140 mg/dL      Peak postprandial:   less than 180 mg/dL (1-2 hours)      Critically ill patients:  140 - 180 mg/dL   Lab Results  Component Value Date   GLUCAP 291 (H) 09/26/2020   HGBA1C 8.2 (H) 08/06/2020    Review of Glycemic Control Results for EARMON, SHERROW (MRN [id number]) as of 09/26/2020 15:02  Ref. Range 09/26/2020 09:21 09/26/2020 11:52 09/26/2020 14:09  Glucose-Capillary Latest Ref Range: 70 - 99 mg/dL 009 (H) 233 (H) 007 (H)   Diabetes history:  T2DM Outpatient Diabetes medications:  NPH 15-17 units BID Current orders for Inpatient glycemic control:  Novolog 0-15 units TID   Note:  Received referral for "glucose management, patient wants to use his own insulin".  Patient is currently in peri-op.  NPH is on formulary.  He will need NPH this evening as he has type 1 diabetes.    -Recommend NPH 14 units BID (80% of home dose) to start tonight.   Spoke with pharmacist in main pharmacy.  Patient cannot use his own insulin as it is against policy.    Will follow up with patient once transferred to floor.  Will continue to follow while inpatient.  Thank you, Dulce Sellar, RN, BSN Diabetes Coordinator Inpatient Diabetes Program [phone] (team pager from 8a-5p)

## 2020-09-26 NOTE — Anesthesia Preprocedure Evaluation (Addendum)
Anesthesia Evaluation  Patient identified by MRN, date of birth, ID band Patient awake    Reviewed: Allergy & Precautions, H&P , NPO status , Patient's Chart, lab work & pertinent test results  Airway Mallampati: II  TM Distance: >3 FB Neck ROM: Full    Dental no notable dental hx. (+) Poor Dentition, Dental Advisory Given   Pulmonary neg pulmonary ROS,    Pulmonary exam normal breath sounds clear to auscultation       Cardiovascular hypertension, Pt. on medications + Peripheral Vascular Disease   Rhythm:Regular Rate:Normal     Neuro/Psych Anxiety Depression negative neurological ROS     GI/Hepatic negative GI ROS, Neg liver ROS,   Endo/Other  diabetes, Type 1, Insulin Dependent  Renal/GU negative Renal ROS  negative genitourinary   Musculoskeletal   Abdominal   Peds  Hematology negative hematology ROS (+)   Anesthesia Other Findings   Reproductive/Obstetrics negative OB ROS                            Anesthesia Physical Anesthesia Plan  ASA: III  Anesthesia Plan: MAC and Regional   Post-op Pain Management:    Induction: Intravenous  PONV Risk Score and Plan: 1 and Ondansetron, Propofol infusion and Midazolam  Airway Management Planned: Simple Face Mask  Additional Equipment:   Intra-op Plan:   Post-operative Plan:   Informed Consent: I have reviewed the patients History and Physical, chart, labs and discussed the procedure including the risks, benefits and alternatives for the proposed anesthesia with the patient or authorized representative who has indicated his/her understanding and acceptance.     Dental advisory given  Plan Discussed with: CRNA  Anesthesia Plan Comments:         Anesthesia Quick Evaluation

## 2020-09-26 NOTE — Progress Notes (Signed)
Patient CBG was 291. MD Joslin Order 5 units of Novolog insulin Patient refused to take that insulin because he said he dose not use that brand at home and he is not going to take a new insulin. The patient stated he was going to give himself his own insulin. I explained to the patient we can give him his home medication but we need to check it in to pharmacy first. He refused to wait and gave himself 15 units of NPH Novolin.  Nurse Notified MD Noreene Larsson that patient gave himself his own insulin. I explained I contacted MD duda   MD Lajoyce Corners was notified . He is going to to put a Diabetes consult in .

## 2020-09-26 NOTE — H&P (Signed)
John Nguyen is an 64 y.o. male.   Chief Complaint: Right foot Gangrene HPI: HPI: Patient is a 64 year old gentleman who is status post revascularization to the right lower extremity and subsequent transmetatarsal amputation. Patient states he has extreme pain swelling and progressive gangrenous changes. Patient states he has not started the nitroglycerin patch. Patient states the pain is extreme and he wants to consider an amputation.   Past Medical History:  Diagnosis Date  . Anxiety   . Depression   . Diabetes mellitus without complication (HCC)   . Hypertension   . Psoriasis   . Type 1 diabetes mellitus (HCC)     Past Surgical History:  Procedure Laterality Date  . ABDOMINAL AORTOGRAM W/LOWER EXTREMITY N/A 08/08/2020   Procedure: ABDOMINAL AORTOGRAM W/LOWER EXTREMITY;  Surgeon: Nada Libman, MD;  Location: [facility] INVASIVE CV LAB;  Service: Cardiovascular;  Laterality: N/A;  . AMPUTATION Right 08/24/2020   Procedure: RIGHT TRANSMETATARSAL AMPUTATION;  Surgeon: Nadara Mustard, MD;  Location: [facility] OR;  Service: Orthopedics;  Laterality: Right;  . arm fracture surgery Left   . PERIPHERAL VASCULAR ATHERECTOMY  08/08/2020   Procedure: PERIPHERAL VASCULAR ATHERECTOMY;  Surgeon: Nada Libman, MD;  Location: [facility] INVASIVE CV LAB;  Service: Cardiovascular;;  . PERIPHERAL VASCULAR INTERVENTION  08/08/2020   Procedure: PERIPHERAL VASCULAR INTERVENTION;  Surgeon: Nada Libman, MD;  Location: [facility]AS[facility] CV LAB;  Service: Cardiovascular;;    Family History  Problem Relation Age of Onset  . Diabetes Mother   . Cancer Mother   . COPD Father    Social History:  reports that he has never smoked. He has never used smokeless tobacco. He reports previous alcohol use. He reports previous drug use. Drug: Marijuana.  Allergies:  Allergies  Allergen Reactions  . Cymbalta [Duloxetine Hcl] Rash    No medications prior to admission.    No results found for this or any previous visit (from  the past 48 hour(s)). No results found.  Review of Systems  All other systems reviewed and are negative.   There were no vitals taken for this visit. Physical Exam  Patient is alert, oriented, no adenopathy, well-dressed, normal affect, normal respiratory effort. Examination patient has black gangrenous changes approximately 3 cm in diameter across the residual limb. There is no purulent drainage no ascending cellulitis.Heart RRR Lungs Clear Assessment/Plan 1. S/P transmetatarsal amputation of foot, right (HCC)   2. Gangrene of right foot (HCC)     Plan: Discussed options including continued conservative treatment with this being a prolonged course with possible revision surgery and the possibility of the wound not healing. Also discussed the options of transtibial amputation and the postoperative course for a below the knee prosthesis. Patient states that he is not willing to put up with this extreme pain he wants to proceed with surgery on Wednesday we will set this up at his convenience. Patient may need inpatient versus outpatient rehabilitation.   [place] Tryphena Perkovich, PA 09/26/2020, 5:45 AM

## 2020-09-26 NOTE — Progress Notes (Signed)
Spoke with the patient and his nurse by phone today.  Patient is refusing any insulin from the hospital and states he can only dose his own insulin from his own insulin at home.  Diabetes nurse coordinator recommended 14 units of NPH twice daily which is 80% of the home dose to start tonight.  Patient is refusing this.  Diabetes coordinator spoke with the pharmacy and states that he cannot use his own insulin as it is against policy.  Patient adamantly will not allow anyone to give him insulin.  He was noted to use his own insulin without permission in the recovery room.  He understands he is at a high risk of complications including diabetic ketoacidosis if he does not let us monitor his blood sugars.  I warned him that this is very dangerous and currently his blood sugar is 288

## 2020-09-26 NOTE — Anesthesia Procedure Notes (Signed)
Anesthesia Regional Block: Popliteal block   Pre-Anesthetic Checklist: ,, timeout performed, Correct Patient, Correct Site, Correct Laterality, Correct Procedure, Correct Position, site marked, Risks and benefits discussed, pre-op evaluation,  At surgeon's request and post-op pain management  Laterality: Right  Prep: Maximum Sterile Barrier Precautions used, chloraprep       Needles:  Injection technique: Single-shot  Needle Type: Echogenic Stimulator Needle     Needle Length: 9cm  Needle Gauge: 21     Additional Needles:   Procedures:,,,, ultrasound used (permanent image in chart),,,,  Narrative:  Start time: 09/26/2020 10:36 AM End time: 09/26/2020 10:46 AM Injection made incrementally with aspirations every 5 mL. Anesthesiologist: Gaynelle Adu, MD  Additional Notes: 2% Lidocaine skin wheel. Adductor canal block with 15cc of 0.5% Bupivicaine plain w/1:200k epi.

## 2020-09-26 NOTE — Progress Notes (Signed)
Post op care continues- The Primary nurse was going over the post op orders with this patient and his medication list.  He was made aware of sliding scale insulin orders.  He became upset and stated "that he would not be taking that fucking insulin and that he had already discussed this and evidently the information wasn't being passed along."  Dr Lajoyce Corners and Chales Abrahams Persons was sent a secured chat message, giving them this information.

## 2020-09-26 NOTE — Interval H&P Note (Signed)
History and Physical Interval Note:  09/26/2020 10:06 AM  John Nguyen  has presented today for surgery, with the diagnosis of gangrene right transmetatarsal amputation.  The various methods of treatment have been discussed with the patient and family. After consideration of risks, benefits and other options for treatment, the patient has consented to  Procedure(s): RIGHT AMPUTATION BELOW KNEE (Right) as a surgical intervention.  The patient's history has been reviewed, patient examined, no change in status, stable for surgery.  I have reviewed the patient's chart and labs.  Questions were answered to the patient's satisfaction.     Nadara Mustard

## 2020-09-27 ENCOUNTER — Encounter (HOSPITAL_COMMUNITY): Payer: Self-pay | Admitting: Orthopedic Surgery

## 2020-09-27 LAB — GLUCOSE, CAPILLARY
Glucose-Capillary: 171 mg/dL — ABNORMAL HIGH (ref 70–99)
Glucose-Capillary: 161 mg/dL — ABNORMAL HIGH (ref 70–99)
Glucose-Capillary: 131 mg/dL — ABNORMAL HIGH (ref 70–99)
Glucose-Capillary: 89 mg/dL (ref 70–99)
Glucose-Capillary: 208 mg/dL — ABNORMAL HIGH (ref 70–99)

## 2020-09-27 MED ORDER — POLYETHYLENE GLYCOL 3350 17 G PO PACK
17.0000 g | PACK | Freq: Every day | ORAL | Status: DC
  Administered 2020-09-29 – 2020-10-04 (×6): 17 g via ORAL
  Filled 2020-09-27 (×7): qty 1

## 2020-09-27 NOTE — TOC Initial Note (Signed)
Transition of Care [facility]) - Initial/Assessment Note    Patient Details  Name: John Nguyen MRN: [id number] Date of Birth: 09-30-1956  Transition of Care [facility]) CM/SW Contact:    Epifanio Lesches, RN Phone Number: 09/27/2020, 2:13 PM  Clinical Narrative:     Admitted with R foot gangrene. Resides @ a boarding house. Supportive brother Onalee Hua.    - s/p R BKA  12/15          NCM spoke with pt @ bedside regarding d/c planning. Shared PT /OT evaluation/ recommendation: CIR. Pt without insurance. States was working @ [employer] full-time prior to illness.  States interested in transitioning to to CIR if accepted.  If not accepted pt states interested in SNF/ rehab. Previous admit pt d/c to home with brother.   Pt States home with brother if unable to transition to CIR or SNF.  TOC team will continue to monitor ad follow for needs .Marland Kitchen...       Expected Discharge Plan: IP Rehab Facility (vs going home with brother with home health services) vs SNF Barriers to Discharge: Continued Medical Work up   Patient Goals and CMS Choice     Choice offered to / list presented to : Patient (pt without insurance, charity case)  Expected Discharge Plan and Services Expected Discharge Plan: IP Rehab Facility (vs going home with brother with home health services)   Discharge Planning Services: CM Consult   Living arrangements for the past 2 months: Boarding House                                      Prior Living Arrangements/Services Living arrangements for the past 2 months: [employer] Lives with:: Self Patient language and need for interpreter reviewed:: Yes Do you feel safe going back to the place where you live?: Yes      Need for Family Participation in Patient Care: Yes (Comment) Care giver support system in place?: No (comment)   Criminal Activity/Legal Involvement Pertinent to Current Situation/Hospitalization: No - Comment as needed  Activities of Daily  Living Home Assistive Devices/Equipment: Crutches,Eyeglasses,CBG Meter ADL Screening (condition at time of admission) Patient's cognitive ability adequate to safely complete daily activities?: Yes Is the patient deaf or have difficulty hearing?: No Does the patient have difficulty seeing, even when wearing glasses/contacts?: No Does the patient have difficulty concentrating, remembering, or making decisions?: No Patient able to express need for assistance with ADLs?: Yes Does the patient have difficulty dressing or bathing?: No Independently performs ADLs?: Yes (appropriate for developmental age) Does the patient have difficulty walking or climbing stairs?: Yes Weakness of Legs: Both Weakness of Arms/Hands: Both  Permission Sought/Granted   Permission granted to share information with : Yes, Verbal Permission Granted  Share Information with NAME: Joseh Sjogren (Brother) [phone]           Emotional Assessment Appearance:: Appears stated age Attitude/Demeanor/Rapport: Gracious Affect (typically observed): Accepting Orientation: : Oriented to Self,Oriented to Place,Oriented to  Time,Oriented to Situation Alcohol / Substance Use: Not Applicable Psych Involvement: No (comment)  Admission diagnosis:  Osteomyelitis [facility]) [M86.9] Patient Active Problem List   Diagnosis Date Noted  . Osteomyelitis (HCC) 09/26/2020  . Osteomyelitis of foot, right, acute (HCC)   . Gangrene of right foot (HCC)   . PVD (peripheral vascular disease) (HCC)   . Cellulitis 08/06/2020  . Diabetes mellitus with peripheral vascular disease (HCC) 08/06/2020  .  Psoriasis 08/06/2020  . Depression 08/06/2020   PCP:  Pcp, No Pharmacy:   [facility] [address]  - [address] [place] [address] Phone: [phone] Fax: [phone]  [facility] [place], [place] - [address] [place]  [address] Phone: [phone] Fax: [phone]  [facility] - [place], [place] - [address] [place] [address] Phone: [phone] Fax: [phone]     Social Determinants of Health (SDOH) Interventions    Readmission Risk Interventions No flowsheet data found.

## 2020-09-27 NOTE — Plan of Care (Signed)

## 2020-09-27 NOTE — Progress Notes (Signed)
Patient is postop day 1 status post right below-knee amputation.  He is lying in bed comfortable.  Alert appropriate to conversation   Vital signs stable VAC is in place.  1 green check.  0 cc in the canister.  Glucose this morning is 171.   Postop day 1.  Discussed with patient discharge planning.  He is open to whatever we think is best for him.  He does live in a rooming house without an elevator and does not on the main floor.  He does say he has been getting up and down stairs after his transmetatarsal amputation though obviously this is a different situation.  More than likely best option is either inpatient rehab or nursing facility.  Will work with physical therapy today

## 2020-09-27 NOTE — Progress Notes (Signed)
Occupational Therapy Evaluation Patient Details Name: John Nguyen MRN: [id number] DOB: 1955-12-11 Today's Date: 09/27/2020    History of Present Illness 64 year old gentleman who is status post revascularization to the right lower extremity and subsequent transmetatarsal amputation. Pt presestns with  extreme pain swelling and progressive gangrenous changes. 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 PMH: Anxiety, depression, DM, HTN, psoriasis, peripheral neuropathy.   Clinical Impression   PTA pt lives in a boarding house with his room on the 2nd floor. Pt scoots up/down stairs on his bottom and crawls to his room/bathroom. Pt uses his knee walker or crutches to mobilize in room, where he stays all week. Pt has a hot plate/micorwave/frig in his room and prepares his own meals. Pt states he has had numerus falls. Recommend short term CIR to maximize functional level of independence with ADL, IADL and mobility and reduce risk of falls to facilitate safe DC home. Pt states it may be possible for him to DC to his brother's house. Discussed with nsg/PA/PT. Will follow acutely.    Follow Up Recommendations  CIR    Equipment Recommendations  3 in 1 bedside commode;Other (comment) (RW)    Recommendations for Other Services Rehab consult     Precautions / Restrictions Precautions Precautions: Fall Precaution Comments: wound vac Required Braces or Orthoses: Other Brace (R residual limb protector) Restrictions RLE Weight Bearing: Non weight bearing      Mobility Bed Mobility Overal bed mobility: Modified Independent                  Transfers Overall transfer level: Needs assistance   Transfers: Sit to/from Stand;Stand Pivot Transfers Sit to Stand: Min assist Stand pivot transfers: Min assist       General transfer comment: vc for hand placement; unsteady initially    Balance Overall balance assessment: History of Falls;Needs assistance    Sitting balance-Leahy Scale: Good       Standing balance-Leahy Scale: Poor                             ADL either performed or assessed with clinical judgement   ADL Overall ADL's : Needs assistance/impaired     Grooming: Set up;Sitting   Upper Body Bathing: Set up;Sitting   Lower Body Bathing: Minimal assistance;Sit to/from stand;Sitting/lateral leans   Upper Body Dressing : Set up;Sitting   Lower Body Dressing: Moderate assistance;Sit to/from stand;Sitting/lateral leans   Toilet Transfer: Minimal assistance;Stand-pivot;RW   Toileting- Clothing Manipulation and Hygiene: Sitting/lateral lean;Minimal assistance;Sit to/from stand       Functional mobility during ADLs: Minimal assistance;Rolling walker;Cueing for safety       Vision         Perception     Praxis      Pertinent Vitals/Pain Pain Assessment: Faces Faces Pain Scale: Hurts a little bit Pain Location: R LE - discomfort with mobility Pain Descriptors / Indicators: Discomfort;Guarding;Grimacing Pain Intervention(s): Limited activity within patient's tolerance;Repositioned;Patient requesting pain meds-RN notified     Hand Dominance Right   Extremity/Trunk Assessment Upper Extremity Assessment Upper Extremity Assessment: Overall WFL for tasks assessed;LUE deficits/detail LUE Sensation: history of peripheral neuropathy   Lower Extremity Assessment Lower Extremity Assessment: Defer to PT evaluation (likes to keep R knee flexed; peripheral neuropathy L foot)   Cervical / Trunk Assessment Cervical / Trunk Assessment: Other exceptions (complaining of sciatica)   Communication Communication Communication: No difficulties   Cognition Arousal/Alertness: Awake/alert Behavior During Therapy: Flat  affect Overall Cognitive Status: No family/caregiver present to determine baseline cognitive functioning                                 General Comments: at baseline   General  Comments  States he may be able to go and stay with his brother short term; unsure of set up for brother's house; pt states "I should be fine, I've been doing this". Once therapist explained his WBS and how this would limit his ability to crawl around or use his knee scooter, pt understood need for rehab    Exercises Exercises: Other exercises Other Exercises Other Exercises: terminal knee extension x 5 Other Exercises: education on importance of knee extenison and not keeping R knee flexed Other Exercises: began education on desensitization   Shoulder Instructions      Home Living Family/patient expects to be discharged to:: Private residence Living Arrangements: Alone;Non-relatives/Friends Available Help at Discharge: Available PRN/intermittently;Friend(s);Family Type of Home: House (boarding house) Home Access: Stairs to enter Secretary/administrator of Steps: 2 Entrance Stairs-Rails: None Home Layout: Two level;Bed/bath upstairs Alternate Level Stairs-Number of Steps: flight   Bathroom Shower/Tub: IT trainer: Standard Bathroom Accessibility: No How Accessible:  (does not think a RW will fit/unsure) Home Equipment: Crutches;Hand held shower head;Other (comment) (knee scooter)          Prior Functioning/Environment Level of Independence: Independent with assistive device(s)        Comments: using crutches for @ 2 months; Has been going up/down steps on his bottom; has had numerous falls due to difficulty with crutch use; crawls to his bedroom/bathroom once upstairs; Has "kitchen set up " in room to cook - does not go downstairs much; generally only baths 1 x/wk; stays in his pajamas most everyday; only dresses if he "needs to go somewhere"; dresses laying down; brother brings him groceries 1x/wk; pt discussing his depression and how he would rather be depressed than be on medication for depression due to the side effects        OT Problem  List: Decreased strength;Impaired balance (sitting and/or standing);Decreased safety awareness;Decreased knowledge of use of DME or AE;Decreased knowledge of precautions;Impaired sensation;Pain      OT Treatment/Interventions: Self-care/ADL training;Therapeutic exercise;DME and/or AE instruction;Therapeutic activities;Patient/family education;Balance training    OT Goals(Current goals can be found in the care plan section) Acute Rehab OT Goals Patient Stated Goal: to "stay alive" OT Goal Formulation: With patient Time For Goal Achievement: 10/11/20 Potential to Achieve Goals: Good  OT Frequency: Min 2X/week   Barriers to D/C:    Pt lives on 2nd story in boarding house       Co-evaluation              AM-PAC OT "6 Clicks" Daily Activity     Outcome Measure Help from another person eating meals?: None Help from another person taking care of personal grooming?: A Little Help from another person toileting, which includes using toliet, bedpan, or urinal?: A Little Help from another person bathing (including washing, rinsing, drying)?: A Little Help from another person to put on and taking off regular upper body clothing?: A Little Help from another person to put on and taking off regular lower body clothing?: A Lot 6 Click Score: 18   End of Session Equipment Utilized During Treatment: Gait belt;Rolling walker Nurse Communication: Mobility status;Patient requests pain meds;Precautions;Weight bearing status  Activity Tolerance: Patient tolerated  treatment well Patient left: in chair;with call bell/phone within reach;with chair alarm set  OT Visit Diagnosis: Unsteadiness on feet (R26.81);Other abnormalities of gait and mobility (R26.89);Muscle weakness (generalized) (M62.81);History of falling (Z91.81);Pain Pain - Right/Left: Right Pain - part of body: Leg                Time: 2952-8413 OT Time Calculation (min): 42 min Charges:  OT General Charges $OT Visit: 1 Visit OT  Evaluation $OT Eval Moderate Complexity: 1 Mod OT Treatments $Self Care/Home Management : 8-22 mins $Therapeutic Activity: 8-22 mins  Luisa Dago, OT/L   Acute OT Clinical Specialist Acute Rehabilitation Services Pager [phone] Office [phone]   [facility] 09/27/2020, 9:41 AM

## 2020-09-27 NOTE — Plan of Care (Signed)

## 2020-09-27 NOTE — Progress Notes (Signed)
Inpatient Rehab Admissions:  Inpatient Rehab Consult received.  I met with patient at the bedside for rehabilitation assessment and to discuss goals and expectations of an inpatient rehab admission.  He is highly motivated for CIR admission.  Per TOC note, could d/c home with brother if he does not reach mod I, though I think it's reasonable to expect he could reach a mod I level for d/c home safely, given PT eval today.  We discussed cost of rehab and screening for medicaid and he is agreeable to both.  Will continue to follow for potential admission pending bed availability (likely early next week).   Signed: Shann Medal, PT, DPT Admissions Coordinator [phone] 09/27/20  4:09 PM

## 2020-09-27 NOTE — Evaluation (Signed)
Physical Therapy Evaluation Patient Details Name: John Nguyen MRN: [id number] DOB: 1956/07/06 Today's Date: 09/27/2020   History of Present Illness  64 year old gentleman who is status post revascularization to the right lower extremity and subsequent transmetatarsal amputation. Pt presestns with  extreme pain swelling and progressive gangrenous changes. 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 PMH: Anxiety, depression, DM, HTN, psoriasis, peripheral neuropathy.  Clinical Impression  Patient received in recliner after OT session. He is agreeable to PT. Reports mild pain in right LE. He transfers sit to stand with min guard. Cues for hand placement and safety. Ambulated with RW and min guard 25 feet. Fatigued with this distance. He requested to stand again to use urinal and was able to stand and balance with 1 UE support on walker and min guard. Patient will continue to benefit from skilled PT while here to improve functional independence and safety with mobility.     Follow Up Recommendations CIR    Equipment Recommendations  Rolling walker with 5" wheels    Recommendations for Other Services       Precautions / Restrictions Precautions Precautions: Fall Precaution Comments: wound vac Required Braces or Orthoses: Other Brace Other Brace: limb protector Restrictions Weight Bearing Restrictions: Yes RLE Weight Bearing: Non weight bearing      Mobility  Bed Mobility Overal bed mobility: Modified Independent             General bed mobility comments: patient received in recliner, remained in recliner    Transfers Overall transfer level: Needs assistance Equipment used: Rolling walker (2 wheeled) Transfers: Sit to/from Stand Sit to Stand: Min guard Stand pivot transfers: Min assist       General transfer comment: vc for hand placement  Ambulation/Gait Ambulation/Gait assistance: Min guard Gait Distance (Feet): 25 Feet Assistive device:  Rolling walker (2 wheeled) Gait Pattern/deviations: Step-to pattern;Decreased stride length Gait velocity: decr   General Gait Details: patient hopped with good tolerance, no lob.  Stairs            Wheelchair Mobility    Modified Rankin (Stroke Patients Only)       Balance Overall balance assessment: History of Falls;Needs assistance Sitting-balance support: Feet supported Sitting balance-Leahy Scale: Good     Standing balance support: Bilateral upper extremity supported;During functional activity Standing balance-Leahy Scale: Fair Standing balance comment: reliant on RW, min guard for safety                             Pertinent Vitals/Pain Pain Assessment: Faces Faces Pain Scale: Hurts a little bit Pain Location: R LE - discomfort with mobility Pain Descriptors / Indicators: Discomfort;Guarding;Grimacing Pain Intervention(s): Limited activity within patient's tolerance;Monitored during session;Repositioned;Patient requesting pain meds-RN notified    Home Living Family/patient expects to be discharged to:: Private residence Living Arrangements: Alone;Non-relatives/Friends Available Help at Discharge: Available PRN/intermittently;Friend(s);Family Type of Home: House (boarding house) Home Access: Stairs to enter Entrance Stairs-Rails: None Secretary/administrator of Steps: 2 Home Layout: Two level;Bed/bath upstairs Home Equipment: Crutches;Hand held shower head;Other (comment)      Prior Function Level of Independence: Independent with assistive device(s)         Comments: using crutches for @ 2 months; Has been going up/down steps on his bottom; has had numerous falls due to difficulty with crutch use; crawls to his bedroom once upstairs; Has "kitchen set up " in room to cook - does not go downstairs much; generally only baths 1  x/wk; stays in his pajamas most everyday; only dresses if he "needs to go somewhere"; dresses laying down     Hand  Dominance   Dominant Hand: Right    Extremity/Trunk Assessment   Upper Extremity Assessment Upper Extremity Assessment: Defer to OT evaluation LUE Sensation: history of peripheral neuropathy    Lower Extremity Assessment Lower Extremity Assessment: Overall WFL for tasks assessed    Cervical / Trunk Assessment Cervical / Trunk Assessment: Other exceptions (complaining of sciatica)  Communication   Communication: No difficulties  Cognition Arousal/Alertness: Awake/alert Behavior During Therapy: WFL for tasks assessed/performed Overall Cognitive Status: Within Functional Limits for tasks assessed                                 General Comments: at baseline      General Comments General comments (skin integrity, edema, etc.): States he may be able to go a dn stay with his brother short term; unsure of set up for brother's house    Exercises Total Joint Exercises Quad Sets: AROM;Right;10 reps Other Exercises Other Exercises: terminal knee extension x 5 Other Exercises: education on importance of knee extenison and not keeping R knee flexed Other Exercises: began education on desensitization   Assessment/Plan    PT Assessment Patient needs continued PT services  PT Problem List Decreased strength;Decreased mobility;Decreased activity tolerance;Decreased balance;Pain;Impaired sensation;Decreased safety awareness       PT Treatment Interventions DME instruction;Therapeutic activities;Gait training;Therapeutic exercise;Stair training;Functional mobility training;Balance training;Patient/family education    PT Goals (Current goals can be found in the Care Plan section)  Acute Rehab PT Goals Patient Stated Goal: none stated PT Goal Formulation: With patient Time For Goal Achievement: 10/04/20    Frequency Min 4X/week   Barriers to discharge Decreased caregiver support      Co-evaluation               AM-PAC PT "6 Clicks" Mobility  Outcome  Measure Help needed turning from your back to your side while in a flat bed without using bedrails?: A Little Help needed moving from lying on your back to sitting on the side of a flat bed without using bedrails?: A Little Help needed moving to and from a bed to a chair (including a wheelchair)?: A Little Help needed standing up from a chair using your arms (e.g., wheelchair or bedside chair)?: A Little Help needed to walk in hospital room?: A Little Help needed climbing 3-5 steps with a railing? : A Lot 6 Click Score: 17    End of Session Equipment Utilized During Treatment: Gait belt Activity Tolerance: Patient tolerated treatment well Patient left: in chair;with call bell/phone within reach;with chair alarm set Nurse Communication: Mobility status PT Visit Diagnosis: Unsteadiness on feet (R26.81);Muscle weakness (generalized) (M62.81);Difficulty in walking, not elsewhere classified (R26.2);Pain;History of falling (Z91.81) Pain - Right/Left: Right Pain - part of body: Leg    Time: 4098-1191 PT Time Calculation (min) (ACUTE ONLY): 19 min   Charges:   PT Evaluation $PT Eval Moderate Complexity: 1 Mod PT Treatments $Gait Training: 8-22 mins        Kartik Fernando, PT, GCS 09/27/20,9:55 AM

## 2020-09-27 NOTE — Progress Notes (Signed)
Spoke with staff RN taking care of this patient. States that patient is allowing staff RNs to give NPH and Novolog insulin as ordered.   Will continue to monitor blood sugars while in the hospital.  Smith Mince RN BSN CDE Diabetes Coordinator Pager: [phone]  8am-5pm

## 2020-09-28 LAB — GLUCOSE, CAPILLARY
Glucose-Capillary: 261 mg/dL — ABNORMAL HIGH (ref 70–99)
Glucose-Capillary: 213 mg/dL — ABNORMAL HIGH (ref 70–99)
Glucose-Capillary: 157 mg/dL — ABNORMAL HIGH (ref 70–99)
Glucose-Capillary: 215 mg/dL — ABNORMAL HIGH (ref 70–99)

## 2020-09-28 NOTE — Plan of Care (Signed)

## 2020-09-28 NOTE — Progress Notes (Signed)
Physical Therapy Treatment Patient Details Name: John Nguyen MRN: [id number] DOB: [birth year] Today's Date: 09/28/2020    History of Present Illness 64 year old gentleman who is status post revascularization to the right lower extremity and subsequent transmetatarsal amputation. Pt presestns with  extreme pain swelling and progressive gangrenous changes. 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 PMH: Anxiety, depression, DM, HTN, psoriasis, peripheral neuropathy.    PT Comments    Patient progressing towards physical therapy goals. Patient performed HEP seated and supine with focus on R LE strengthening. Patient ambulated 5' with RW and min guard, no LOB noted. Patient continues to be limited by impaired balance, decreased activity tolerance, generalized weakness. Continue to recommend comprehensive inpatient rehab (CIR) for post-acute therapy needs.     Follow Up Recommendations  CIR     Equipment Recommendations  Rolling Montoya Watkin with 5" wheels    Recommendations for Other Services       Precautions / Restrictions Precautions Precautions: Fall Precaution Comments: wound vac Required Braces or Orthoses: Other Brace Other Brace: limb protector Restrictions Weight Bearing Restrictions: Yes RLE Weight Bearing: Non weight bearing    Mobility  Bed Mobility Overal bed mobility: Modified Independent                Transfers Overall transfer level: Needs assistance Equipment used: Rolling Veneda Kirksey (2 wheeled) Transfers: Sit to/from Stand Sit to Stand: Min guard         General transfer comment: cues for hand placement  Ambulation/Gait Ambulation/Gait assistance: Min guard Gait Distance (Feet): 35 Feet Assistive device: Rolling Tallin Hart (2 wheeled) Gait Pattern/deviations: Step-to pattern;Decreased stride length     General Gait Details: patient hopped with good tolerance, no lob.   Stairs             Wheelchair Mobility     Modified Rankin (Stroke Patients Only)       Balance Overall balance assessment: History of Falls;Needs assistance Sitting-balance support: Feet supported Sitting balance-Leahy Scale: Good     Standing balance support: Bilateral upper extremity supported;During functional activity Standing balance-Leahy Scale: Fair Standing balance comment: reliant on RW, min guard for safety                            Cognition Arousal/Alertness: Awake/alert Behavior During Therapy: WFL for tasks assessed/performed Overall Cognitive Status: Within Functional Limits for tasks assessed                                 General Comments: at baseline      Exercises Amputee Exercises Quad Sets: Right;10 reps Hip Flexion/Marching: Right;10 reps Knee Flexion: Right;10 reps;Supine Knee Extension: Right;10 reps;Seated Straight Leg Raises: Right;10 reps    General Comments General comments (skin integrity, edema, etc.): educated patient on positioning of residual limb in bed and recliner      Pertinent Vitals/Pain Pain Assessment: No/denies pain    Home Living                      Prior Function            PT Goals (current goals can now be found in the care plan section) Acute Rehab PT Goals Patient Stated Goal: none stated PT Goal Formulation: With patient Time For Goal Achievement: 10/04/20 Progress towards PT goals: Progressing toward goals    Frequency    Min 4X/week  PT Plan Current plan remains appropriate    Co-evaluation              AM-PAC PT "6 Clicks" Mobility   Outcome Measure  Help needed turning from your back to your side while in a flat bed without using bedrails?: None Help needed moving from lying on your back to sitting on the side of a flat bed without using bedrails?: None Help needed moving to and from a bed to a chair (including a wheelchair)?: A Little Help needed standing up from a chair using your  arms (e.g., wheelchair or bedside chair)?: A Little Help needed to walk in hospital room?: A Little Help needed climbing 3-5 steps with a railing? : A Lot 6 Click Score: 19    End of Session Equipment Utilized During Treatment: Gait belt Activity Tolerance: Patient tolerated treatment well Patient left: in chair;with call bell/phone within reach Nurse Communication: Mobility status PT Visit Diagnosis: Unsteadiness on feet (R26.81);Muscle weakness (generalized) (M62.81);Difficulty in walking, not elsewhere classified (R26.2);Pain;History of falling (Z91.81) Pain - Right/Left: Right Pain - part of body: Leg     Time: 5277-8242 PT Time Calculation (min) (ACUTE ONLY): 25 min  Charges:  $Therapeutic Exercise: 8-22 mins $Therapeutic Activity: 8-22 mins                     Gregor Hams, PT, DPT Acute Rehabilitation Services Pager [phone] Office [phone]    Zannie Kehr Allred 09/28/2020, 3:04 PM

## 2020-09-28 NOTE — Discharge Summary (Signed)
Discharge Diagnoses:  Active Problems:   Gangrene of right foot (HCC)   Osteomyelitis (HCC)   Surgeries: Procedure(s): RIGHT AMPUTATION BELOW KNEE on 09/26/2020    Consultants:   Discharged Condition: Improved  Hospital Course: John Nguyen is an 64 y.o. male who was admitted 09/26/2020 with a chief complaint of Right Foot Osteomyelitis, with a final diagnosis of gangrene right transmetatarsal amputation.  Patient was brought to the operating room on 09/26/2020 and underwent Procedure(s): RIGHT AMPUTATION BELOW KNEE.    Patient was given perioperative antibiotics:  Anti-infectives (From admission, onward)   Start     Dose/Rate Route Frequency Ordered Stop   09/26/20 1700  ceFAZolin (ANCEF) IVPB 2g/100 mL premix        2 g 200 mL/hr over 30 Minutes Intravenous Every 6 hours 09/26/20 1518 09/27/20 0523   09/26/20 0915  ceFAZolin (ANCEF) IVPB 2g/100 mL premix        2 g 200 mL/hr over 30 Minutes Intravenous On call to O.R. 09/26/20 0902 09/26/20 1144    .  Patient was given sequential compression devices, early ambulation, and aspirin for DVT prophylaxis.  Recent vital signs:  Patient Vitals for the past 24 hrs:  BP Temp Temp src Pulse Resp SpO2  09/28/20 1109 118/69 99 F (37.2 C) Oral 76 17 98 %  09/28/20 0752 122/71 98 F (36.7 C) Oral 81 16 99 %  09/28/20 0351 132/68 98.4 F (36.9 C) Oral 78 16 99 %  09/27/20 1940 134/70 97.6 F (36.4 C) Oral 82 15 99 %  09/27/20 1555 118/66 99.2 F (37.3 C) Oral 79 17 100 %  09/27/20 1200 138/68 98.5 F (36.9 C) Oral 80 16 99 %  .  Recent laboratory studies: No results found.  Discharge Medications:   Allergies as of 09/28/2020      Reactions   Cymbalta [duloxetine Hcl] Rash      Medication List    STOP taking these medications   doxycycline 100 MG tablet Commonly known as: VIBRA-TABS   gabapentin 100 MG capsule Commonly known as: NEURONTIN   nitroGLYCERIN 0.2 mg/hr patch Commonly known as: NITRODUR - Dosed in  mg/24 hr   oxyCODONE-acetaminophen 5-325 MG tablet Commonly known as: Percocet     TAKE these medications   aspirin 81 MG EC tablet Take 1 tablet (81 mg total) by mouth daily. Swallow whole.   atorvastatin 80 MG tablet Commonly known as: LIPITOR Take 1 tablet (80 mg total) by mouth daily.   clopidogrel 75 MG tablet Commonly known as: PLAVIX Take 1 tablet (75 mg total) by mouth daily with breakfast.   diphenhydrAMINE 25 mg capsule Commonly known as: BENADRYL Take 25 mg by mouth daily.   insulin NPH Human 100 UNIT/ML injection Commonly known as: NOVOLIN N Inject 0.12 mLs (12 Units total) into the skin 2 (two) times daily. What changed:   how much to take  when to take this  additional instructions   lisinopril 5 MG tablet Commonly known as: ZESTRIL Take 1 tablet (5 mg total) by mouth daily.   multivitamin with minerals Tabs tablet Take 1 tablet by mouth daily.   polyethylene glycol 17 g packet Commonly known as: MIRALAX / GLYCOLAX Take 17 g by mouth daily.       Diagnostic Studies: VAS [place] ABI WITH/WO TBI  Result Date: 09/20/2020 LOWER EXTREMITY DOPPLER STUDY Indications: Peripheral artery disease. High Risk Factors: Hypertension, Diabetes.  Vascular Interventions: 08/08/2020: Abdom. aortogram w/ LE. Rt pop stent and  angioplasty of rt tibioperoneal trunk and peroneal                         artery.                          Right foot toes amputated. Comparison Study: None Performing Technologist: Ethelle Lyon  Examination Guidelines: A complete evaluation includes at minimum, Doppler waveform signals and systolic blood pressure reading at the level of bilateral brachial, anterior tibial, and posterior tibial arteries, when vessel segments are accessible. Bilateral testing is considered an integral part of a complete examination. Photoelectric Plethysmograph (PPG) waveforms and toe systolic pressure readings are included as required and additional  duplex testing as needed. Limited examinations for reoccurring indications may be performed as noted.  ABI Findings: +---------+------------------+-----+--------+---------+ Right    Rt Pressure (mmHg)IndexWaveformComment   +---------+------------------+-----+--------+---------+ Brachial 168                                      +---------+------------------+-----+--------+---------+ PTA      235               1.40 biphasic          +---------+------------------+-----+--------+---------+ DP       220               1.31 biphasic          +---------+------------------+-----+--------+---------+ Great Toe                               amputated +---------+------------------+-----+--------+---------+ +---------+------------------+-----+--------+-------+ Left     Lt Pressure (mmHg)IndexWaveformComment +---------+------------------+-----+--------+-------+ Brachial 154                                    +---------+------------------+-----+--------+-------+ PTA      201               1.20 biphasic        +---------+------------------+-----+--------+-------+ DP       76                0.45 biphasic        +---------+------------------+-----+--------+-------+ Great Toe60                0.36                 +---------+------------------+-----+--------+-------+ +-------+-----------+-----------+------------+------------+ ABI/TBIToday's ABIToday's TBIPrevious ABIPrevious TBI +-------+-----------+-----------+------------+------------+ Right  1.40       NA         0.71        NA           +-------+-----------+-----------+------------+------------+ Left   1.20       0.36       0.96        0.16         +-------+-----------+-----------+------------+------------+  Arterial wall calcification precludes accurate ankle pressures and ABIs.  Summary: Right: Resting right ankle-brachial index indicates noncompressible right lower extremity arteries. Left: Resting left  ankle-brachial index is within normal range. No evidence of significant left lower extremity arterial disease. The left toe-brachial index is abnormal. ABIs are unreliable.  *See table(s) above for measurements and observations.  Electronically signed by Fabienne Bruns MD on 09/20/2020 at 4:24:41 PM.    Final  VAS [place] LOWER EXTREMITY ARTERIAL DUPLEX  Result Date: 09/20/2020 LOWER EXTREMITY ARTERIAL DUPLEX STUDY Indications: Peripheral artery disease. High Risk Factors: Hypertension, Diabetes.  Vascular Interventions: 08/08/2020: Abdom. aortogram w/ LE. Rt pop stent and                         angioplasty of rt tibioperoneal trunk and peroneal                         artery.                          Right foot toes amputated. Current ABI:            ABI's unreliable due to calcification Comparison Study: None Performing Technologist: Ethelle Lyon  Examination Guidelines: A complete evaluation includes B-mode imaging, spectral Doppler, color Doppler, and power Doppler as needed of all accessible portions of each vessel. Bilateral testing is considered an integral part of a complete examination. Limited examinations for reoccurring indications may be performed as noted.  +-----------+--------+-----+--------+---------+--------+ RIGHT      PSV cm/sRatioStenosisWaveform Comments +-----------+--------+-----+--------+---------+--------+ CFA Prox   133                  triphasic         +-----------+--------+-----+--------+---------+--------+ DFA        115                  biphasic          +-----------+--------+-----+--------+---------+--------+ SFA Prox   98                   triphasic         +-----------+--------+-----+--------+---------+--------+ SFA Mid    80                   biphasic          +-----------+--------+-----+--------+---------+--------+ SFA Distal 58                   biphasic          +-----------+--------+-----+--------+---------+--------+ POP Prox                                  stent    +-----------+--------+-----+--------+---------+--------+ ATA Distal 45                   biphasic          +-----------+--------+-----+--------+---------+--------+ PTA Distal 34                   biphasic          +-----------+--------+-----+--------+---------+--------+ PERO Distal132                  biphasic          +-----------+--------+-----+--------+---------+--------+  Right Stent(s): +---------------+--------+--------+---------+--------+ Popliteal      PSV cm/sStenosisWaveform Comments +---------------+--------+--------+---------+--------+ Prox to Stent  53              biphasic          +---------------+--------+--------+---------+--------+ Proximal Stent 53              triphasic         +---------------+--------+--------+---------+--------+ Mid Stent      43              triphasic         +---------------+--------+--------+---------+--------+  Distal Stent   52              triphasic         +---------------+--------+--------+---------+--------+ Distal to Stent33              triphasic         +---------------+--------+--------+---------+--------+     Summary:  Right: Patent right popliteal stent with no evidence of stenosis.  See table(s) above for measurements and observations. Electronically signed by Fabienne Brunsharles Fields MD on 09/20/2020 at 4:17:51 PM.    Final     Patient benefited maximally from their hospital stay and there were no complications.     Disposition: Discharge disposition: 62-Rehab Facility      Discharge Instructions    Call MD / Call 911   Complete by: As directed    If you experience chest pain or shortness of breath, CALL 911 and be transported to the hospital emergency room.  If you develope a fever above 101 F, pus (white drainage) or increased drainage or redness at the wound, or calf pain, call your surgeon's office.   Constipation Prevention   Complete by: As directed     Drink plenty of fluids.  Prune juice may be helpful.  You may use a stool softener, such as Colace (over the counter) 100 mg twice a day.  Use MiraLax (over the counter) for constipation as needed.   Diet - low sodium heart healthy   Complete by: As directed    Increase activity slowly as tolerated   Complete by: As directed    Negative Pressure Wound Therapy - Incisional   Complete by: As directed    Show patient how to attach prevena      Follow-up Information    Shaquilla Kehres, [place], [place] In 1 week.   Specialty: Orthopedic Surgery Contact information: [address] [place] [address] [phone]                Signed: [place] Bali[place] Anne Kaia [place] 09/28/2020, 11:27 AM

## 2020-09-28 NOTE — Plan of Care (Signed)

## 2020-09-28 NOTE — Progress Notes (Signed)
Inpatient Rehab Admissions Coordinator:   Unfortunately, I no longer have a bed available for this patient on Sunday.  Will continue to follow for timing of rehab admission.  In case a bed becomes available, my colleague, Wolfgang Phoenix, will f/u this weekend.   Estill Dooms, PT, DPT Admissions Coordinator [phone] 09/28/20  4:57 PM

## 2020-09-28 NOTE — Progress Notes (Signed)
Patient is postop day two status post below-knee amputation.  He is comfortable in bed he states he was concerned his blood sugar was high and was awaiting some correction insulin.  Vital signs stable wound VAC in place has one green check.  0 cc in the canister  Patient feels CIR would be best option hopefully he can do this.  If not skilled nursing.  May be difficult for him to go home.

## 2020-09-28 NOTE — Progress Notes (Signed)
Inpatient Rehab Admissions Coordinator:    I have a bed available for this pt to admit to CIR on Sunday (12/19) and [place] Persons, PA-C, in agreement.  Rehab MD (Dr. Berline Chough) to assess pt and confirm admission on Sunday.  Floor RN can call CIR at [phone] for report after 12pm.  I will let pt/family and case manager know.   Estill Dooms, PT, DPT Admissions Coordinator [phone] 09/28/20  12:19 PM

## 2020-09-29 LAB — GLUCOSE, CAPILLARY
Glucose-Capillary: 221 mg/dL — ABNORMAL HIGH (ref 70–99)
Glucose-Capillary: 255 mg/dL — ABNORMAL HIGH (ref 70–99)
Glucose-Capillary: 177 mg/dL — ABNORMAL HIGH (ref 70–99)
Glucose-Capillary: 215 mg/dL — ABNORMAL HIGH (ref 70–99)

## 2020-09-29 NOTE — Progress Notes (Signed)
   Subjective: 3 Days Post-Op Procedure(s) (LRB): RIGHT AMPUTATION BELOW KNEE (Right) Patient reports pain as mild and moderate.    Objective: Vital signs in last 24 hours: Temp:  [98.1 F (36.7 C)-99.4 F (37.4 C)] 98.1 F (36.7 C) (12/18 0748) Pulse Rate:  [76-85] 79 (12/18 0748) Resp:  [16-18] 18 (12/18 0748) BP: (118-135)/(69-73) 119/71 (12/18 0748) SpO2:  [98 %-99 %] 98 % (12/18 0748)  Intake/Output from previous day: 12/17 0701 - 12/18 0700 In: 0  Out: 400 [Urine:400] Intake/Output this shift: Total I/O In: -  Out: 400 [Urine:400]  Recent Labs    09/26/20 0953  HGB 13.8   Recent Labs    09/26/20 0953  WBC 9.5  RBC 4.61  HCT 41.3  PLT 358   Recent Labs    09/26/20 0953  NA 136  K 4.3  CL 103  CO2 22  BUN 10  CREATININE 0.96  GLUCOSE 189*  CALCIUM 9.0   No results for input(s): LABPT, INR in the last 72 hours.  VAC intact.  No results found.  Assessment/Plan: 3 Days Post-Op Procedure(s) (LRB): RIGHT AMPUTATION BELOW KNEE (Right) Up with therapy, he states plan is rehab admission  Eldred Manges 09/29/2020, 8:43 AM

## 2020-09-29 NOTE — Plan of Care (Signed)
  Problem: Activity: Goal: Risk for activity intolerance will decrease Outcome: Progressing   Problem: Nutrition: Goal: Adequate nutrition will be maintained Outcome: Progressing   Problem: Coping: Goal: Level of anxiety will decrease Outcome: Progressing   Problem: Safety: Goal: Ability to remain free from injury will improve Outcome: Progressing   Problem: Skin Integrity: Goal: Risk for impaired skin integrity will decrease Outcome: Progressing   Problem: Pain Managment: Goal: General experience of comfort will improve Outcome: Progressing

## 2020-09-29 NOTE — Plan of Care (Signed)

## 2020-09-30 ENCOUNTER — Inpatient Hospital Stay (HOSPITAL_COMMUNITY)
Admission: RE | Admit: 2020-09-30 | Payer: Self-pay | Source: Intra-hospital | Admitting: Physical Medicine and Rehabilitation

## 2020-09-30 LAB — GLUCOSE, CAPILLARY
Glucose-Capillary: 186 mg/dL — ABNORMAL HIGH (ref 70–99)
Glucose-Capillary: 212 mg/dL — ABNORMAL HIGH (ref 70–99)
Glucose-Capillary: 173 mg/dL — ABNORMAL HIGH (ref 70–99)
Glucose-Capillary: 262 mg/dL — ABNORMAL HIGH (ref 70–99)
Glucose-Capillary: 171 mg/dL — ABNORMAL HIGH (ref 70–99)

## 2020-09-30 NOTE — Plan of Care (Signed)
  Problem: Education: Goal: Knowledge of General Education information will improve Description: Including pain rating scale, medication(s)/side effects and non-pharmacologic comfort measures Outcome: Progressing   Problem: Clinical Measurements: Goal: Ability to maintain clinical measurements within normal limits will improve Outcome: Progressing Goal: Will remain free from infection Outcome: Progressing Goal: Diagnostic test results will improve Outcome: Progressing Goal: Respiratory complications will improve Outcome: Progressing Goal: Cardiovascular complication will be avoided Outcome: Progressing   Problem: Elimination: Goal: Will not experience complications related to bowel motility Outcome: Progressing Goal: Will not experience complications related to urinary retention Outcome: Progressing   Problem: Coping: Goal: Level of anxiety will decrease Outcome: Progressing   Problem: Pain Managment: Goal: General experience of comfort will improve Outcome: Progressing   Problem: Skin Integrity: Goal: Risk for impaired skin integrity will decrease Outcome: Progressing   

## 2020-09-30 NOTE — Plan of Care (Signed)

## 2020-09-30 NOTE — Progress Notes (Signed)
   Subjective: 4 Days Post-Op Procedure(s) (LRB): RIGHT AMPUTATION BELOW KNEE (Right) Patient reports pain as mild.    Objective: Vital signs in last 24 hours: Temp:  [97.5 F (36.4 C)-98.8 F (37.1 C)] 97.5 F (36.4 C) (12/19 0833) Pulse Rate:  [69-79] 77 (12/19 0833) Resp:  [16-18] 16 (12/19 0833) BP: (121-140)/(65-68) 140/65 (12/19 0833) SpO2:  [98 %-100 %] 100 % (12/19 0833)  Intake/Output from previous day: 12/18 0701 - 12/19 0700 In: -  Out: 700 [Urine:700] Intake/Output this shift: Total I/O In: -  Out: 500 [Urine:500]  No results for input(s): HGB in the last 72 hours. No results for input(s): WBC, RBC, HCT, PLT in the last 72 hours. No results for input(s): NA, K, CL, CO2, BUN, CREATININE, GLUCOSE, CALCIUM in the last 72 hours. No results for input(s): LABPT, INR in the last 72 hours.  VAC seal good No results found.  Assessment/Plan: 4 Days Post-Op Procedure(s) (LRB): RIGHT AMPUTATION BELOW KNEE (Right) Up with therapy. Rehab did not have Sunday bed. Continue PT. Possible rehab admission early in week.   Eldred Manges 09/30/2020, 10:49 AM

## 2020-10-01 ENCOUNTER — Inpatient Hospital Stay (HOSPITAL_COMMUNITY): Payer: Self-pay

## 2020-10-01 ENCOUNTER — Inpatient Hospital Stay (HOSPITAL_COMMUNITY): Payer: Self-pay | Admitting: Occupational Therapy

## 2020-10-01 ENCOUNTER — Encounter (HOSPITAL_COMMUNITY): Payer: Self-pay | Admitting: Orthopedic Surgery

## 2020-10-01 LAB — GLUCOSE, CAPILLARY
Glucose-Capillary: 89 mg/dL (ref 70–99)
Glucose-Capillary: 198 mg/dL — ABNORMAL HIGH (ref 70–99)
Glucose-Capillary: 188 mg/dL — ABNORMAL HIGH (ref 70–99)
Glucose-Capillary: 311 mg/dL — ABNORMAL HIGH (ref 70–99)

## 2020-10-01 LAB — SURGICAL PATHOLOGY

## 2020-10-01 NOTE — Progress Notes (Signed)
Patient is postop day 5 status post below-knee amputation.  Unfortunately there is no bed available for him in inpatient rehab.  Patient does have a brother that he could stay with however his brother works daily.  Patient is open to going out to a nursing facility however he is afraid he cannot afford it  Vital signs stable afebrile wound VAC has 1 green check 0 cc in the canister good pain control   Status post above.  Will reach out to transition of care to see if the nursing facility is possibility for him.  If not he would go to his brothers with home health.

## 2020-10-01 NOTE — Progress Notes (Signed)
OT Cancellation Note  Patient Details Name: John Nguyen MRN: [id number] DOB: 1956-06-19   Cancelled Treatment:    Reason Eval/Treat Not Completed: Patient declined, no reason specified. Pt reporting pain (10/10)/fatigue and has just received pain medicine. Pt politely declined OT session and requested a session tomorrow AM.   Lorre Munroe 10/01/2020, 1:25 PM

## 2020-10-01 NOTE — Progress Notes (Signed)
Inpatient Rehab Admissions Coordinator:   Unfortunately, I do not have a bed for Mr. Landini on rehab today.  Discussing with Gae Gallop, RN CM, home versus SNF.  Will continue to follow until d/c plan arranged or bed available on CIR.    Estill Dooms, PT, DPT Admissions Coordinator [phone] 10/01/20  10:14 AM

## 2020-10-01 NOTE — Plan of Care (Signed)

## 2020-10-01 NOTE — Progress Notes (Signed)
Physical Therapy Treatment Patient Details Name: John Nguyen MRN: [id number] DOB: 12-Apr-1956 Today's Date: 10/01/2020    History of Present Illness 64 year old gentleman who is status post revascularization to the right lower extremity and subsequent transmetatarsal amputation. Pt presestns with  extreme pain swelling and progressive gangrenous changes. 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 PMH: Anxiety, depression, DM, HTN, psoriasis, peripheral neuropathy.    PT Comments    Session limited by pain with patient declining further ambulation due to increased pain. Educated patient on positioning of R residual limb to prevent knee flexion contractures and importance of knee extension for prosthetic use. Patient modI for bed mobility and supervision for sit to stand transfer. Patient ambulated 20' with RW and min guard for safety. Performed bed level exercises focusing on R LE strengthening and R knee extension ROM. Patient continues to be limited by pain, decreased activity tolerance, generalized weakness, impaired balance, and impaired functional mobility. Continue to recommend comprehensive inpatient rehab (CIR) for post-acute therapy needs.     Follow Up Recommendations  CIR     Equipment Recommendations  [place] with 5" wheels    Recommendations for Other Services       Precautions / Restrictions Precautions Precautions: Fall Precaution Comments: wound vac Required Braces or Orthoses: Other Brace Other Brace: limb protector Restrictions Weight Bearing Restrictions: Yes RLE Weight Bearing: Non weight bearing    Mobility  Bed Mobility Overal bed mobility: Modified Independent                Transfers Overall transfer level: Needs assistance Equipment used: Rolling [place] Weible (2 wheeled) Transfers: Sit to/from Stand Sit to Stand: Supervision         General transfer comment: supervision for  safety  Ambulation/Gait Ambulation/Gait assistance: Min guard Gait Distance (Feet): 12 Feet Assistive device: Rolling Juline [place] (2 wheeled) Gait Pattern/deviations: Step-to pattern;Decreased stride length Gait velocity: decreased   General Gait Details: patient declined further ambulation due to increased pain with ambulation   Stairs             Wheelchair Mobility    Modified Rankin (Stroke Patients Only)       Balance Overall balance assessment: History of Falls;Needs assistance Sitting-balance support: Feet supported Sitting balance-Leahy Scale: Good     Standing balance support: Bilateral upper extremity supported;During functional activity Standing balance-Leahy Scale: Fair Standing balance comment: reliant on RW, min guard for safety                            Cognition Arousal/Alertness: Awake/alert Behavior During Therapy: WFL for tasks assessed/performed Overall Cognitive Status: Within Functional Limits for tasks assessed                                 General Comments: at baseline      Exercises Amputee Exercises Quad Sets: Right;10 reps Hip ABduction/ADduction: Right;10 reps Straight Leg Raises: Right;20 reps Other Exercises Other Exercises: prone hamstring stretch x 1 minute    General Comments        Pertinent Vitals/Pain Pain Assessment: 0-10 Pain Score: 8  Pain Location: R LE - discomfort with mobility Pain Descriptors / Indicators: Burning;Discomfort;Grimacing;Guarding Pain Intervention(s): Monitored during session    Home Living                      Prior Function  PT Goals (current goals can now be found in the care plan section) Acute Rehab PT Goals Patient Stated Goal: to go to rehab PT Goal Formulation: With patient Time For Goal Achievement: 10/04/20 Progress towards PT goals: Progressing toward goals    Frequency    Min 4X/week      PT Plan Current plan remains  appropriate    Co-evaluation              AM-PAC PT "6 Clicks" Mobility   Outcome Measure  Help needed turning from your back to your side while in a flat bed without using bedrails?: None Help needed moving from lying on your back to sitting on the side of a flat bed without using bedrails?: None Help needed moving to and from a bed to a chair (including a wheelchair)?: A Little Help needed standing up from a chair using your arms (e.g., wheelchair or bedside chair)?: A Little Help needed to walk in hospital room?: A Little Help needed climbing 3-5 steps with a railing? : A Lot 6 Click Score: 19    End of Session Equipment Utilized During Treatment: Gait belt Activity Tolerance: Patient tolerated treatment well Patient left: in bed;with call bell/phone within reach;with bed alarm set Nurse Communication: Mobility status PT Visit Diagnosis: Unsteadiness on feet (R26.81);Muscle weakness (generalized) (M62.81);Difficulty in walking, not elsewhere classified (R26.2);Pain;History of falling (Z91.81) Pain - Right/Left: Right Pain - part of body: Leg     Time: 3300-7622 PT Time Calculation (min) (ACUTE ONLY): 29 min  Charges:  $Therapeutic Exercise: 8-22 mins $Therapeutic Activity: 8-22 mins                     John Nguyen, PT, DPT Acute Rehabilitation Services Pager [phone] Office [phone]    John Nguyen 10/01/2020, 12:48 PM

## 2020-10-01 NOTE — NC FL2 (Signed)
[place] MEDICAID FL2 LEVEL OF CARE SCREENING TOOL     IDENTIFICATION  Patient Name: John Nguyen Birthdate: 1956/02/22 Sex: male Admission Date (Current Location): 09/26/2020  [facility] and [place] Number:  Producer, television/film/video and Address:  [facility] [place]. [facility], [address], [place], [place] [address]      Provider Number: [id number]  Attending Physician Name and Address:  Nadara Mustard, MD  Relative Name and Phone Number:       Current Level of Care: Hospital Recommended Level of Care: Skilled Nursing Facility Prior Approval Number:    Date Approved/Denied:   PASRR Number: [id number]  Discharge Plan: SNF    Current Diagnoses: Patient Active Problem List   Diagnosis Date Noted  . Osteomyelitis (HCC) 09/26/2020  . Osteomyelitis of foot, right, acute (HCC)   . Gangrene of right foot (HCC)   . PVD (peripheral vascular disease) (HCC)   . Cellulitis 08/06/2020  . Diabetes mellitus with peripheral vascular disease (HCC) 08/06/2020  . Psoriasis 08/06/2020  . 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)                       Personal Care Assistance Level of Assistance  Bathing,Feeding,Dressing Bathing Assistance: Maximum assistance Feeding assistance: Independent Dressing Assistance: Maximum assistance     Functional Limitations Info  Sight,Hearing,Speech Sight Info: Adequate Hearing Info: Adequate Speech Info: Adequate    SPECIAL CARE FACTORS FREQUENCY  PT (By licensed PT),OT (By licensed OT)     PT Frequency: 5x/ week, evaluate and treat OT Frequency: 5x/ week, evaluate and treat            Contractures Contractures  Info: Not present    Additional Factors Info  Code Status,Allergies Code Status Info: Full code Allergies Info: Cymbalta Duloxetine Hcl           Current Medications (10/01/2020):  This is the current hospital active medication list Current Facility-Administered Medications  Medication Dose Route Frequency Provider Last Rate Last Admin  . 0.9 %  sodium chloride infusion   Intravenous Continuous Persons, 10/03/2020, [place]   Stopped at 09/27/20 0831  . acetaminophen (TYLENOL) tablet 325-650 mg  325-650 mg Oral Q6H PRN Persons, 09/29/20, [place]   650 mg at 09/28/20 1236  . aspirin EC tablet 81 mg  81 mg Oral Daily Persons, 09/30/20, [place]   81 mg at 10/01/20 0831  . atorvastatin (LIPITOR) tablet 80 mg  80 mg Oral Daily Persons, 10/03/20, [place]      . clopidogrel (PLAVIX) tablet 75 mg  75 mg Oral Q breakfast Persons, [place], PA   75 mg at 10/01/20 0831  . HYDROmorphone (DILAUDID) injection 0.5 mg  0.5 mg Intravenous Q4H PRN Persons, 10/03/20, PA      . insulin aspart (novoLOG) injection 0-15 Units  0-15 Units Subcutaneous TID WC Persons, 12-12-1984, [place]   3 Units at 10/01/20 1159  . insulin NPH Human (NOVOLIN N) injection 9 Units  9 Units Subcutaneous BID AC & HS 10/03/20, MD   9 Units at 10/01/20 [phone]  . metoCLOPramide (REGLAN) tablet 5-10 mg  5-10  mg Oral Q8H PRN Persons, [place], PA       Or  . metoCLOPramide (REGLAN) injection 5-10 mg  5-10 mg Intravenous Q8H PRN Persons, [place], PA      . ondansetron [facility]) tablet 4 mg  4 mg Oral Q6H PRN Persons, [place], PA       Or  . ondansetron [facility]) injection 4 mg  4 mg Intravenous Q6H PRN Persons, [place], PA      . oxyCODONE (Oxy IR/ROXICODONE) immediate release tablet 5-10 mg  5-10 mg Oral Q4H PRN Persons, [place], PA   10 mg at 10/01/20 1314  . polyethylene glycol (MIRALAX / GLYCOLAX) packet 17 g  17 g Oral Daily Persons, [place], [place]   17 g at 10/01/20 0830     Discharge Medications: Please see discharge summary for a list  of discharge medications.  Relevant Imaging Results:  Relevant Lab Results:   Additional Information SS# [id number]  Epifanio Lesches, RN

## 2020-10-02 LAB — GLUCOSE, CAPILLARY
Glucose-Capillary: 220 mg/dL — ABNORMAL HIGH (ref 70–99)
Glucose-Capillary: 243 mg/dL — ABNORMAL HIGH (ref 70–99)
Glucose-Capillary: 221 mg/dL — ABNORMAL HIGH (ref 70–99)
Glucose-Capillary: 126 mg/dL — ABNORMAL HIGH (ref 70–99)

## 2020-10-02 MED ORDER — OXYCODONE-ACETAMINOPHEN 5-325 MG PO TABS: 1.0000 | ORAL_TABLET | ORAL | 0 refills | Status: DC | PRN

## 2020-10-02 NOTE — Progress Notes (Signed)
Patient is postop day four status post right below-knee amputation.  He is comfortable in his bed.  Planning on discharging to skilled nursing  Vital signs stable afebrile alert pleasant to exam.  Says his pain is well controlled.  Wound VAC is working with one green check 0 cc in the canister   Plan for discharge as soon as nursing home bed available.  Wound VAC will be discontinued prior to patient's departure to the nursing facility

## 2020-10-02 NOTE — Progress Notes (Signed)
Inpatient Diabetes Program Recommendations  AACE/ADA: New Consensus Statement on Inpatient Glycemic Control (2015)  Target Ranges:  Prepandial:   less than 140 mg/dL      Peak postprandial:   less than 180 mg/dL (1-2 hours)      Critically ill patients:  140 - 180 mg/dL   Lab Results  Component Value Date   GLUCAP 243 (H) 10/02/2020   HGBA1C 8.2 (H) 08/06/2020    Review of Glycemic Control Results for MACGUIRE, HOLSINGER (MRN [id number]) as of 10/02/2020 12:18  Ref. Range 10/01/2020 21:06 10/02/2020 06:42 10/02/2020 11:23  Glucose-Capillary Latest Ref Range: 70 - 99 mg/dL 267 (H) 124 (H) 580 (H)   Diabetes history: Type 1 DM Outpatient Diabetes medications: NPH 15-17 units BID Current orders for Inpatient glycemic control: Novolog 0-15 units TID, NPH 9 units BID  Inpatient Diabetes Program Recommendations:    Consider increasing correction to Novolog 0-20 units TID and adding Novolog 0-5 units QHS.   Thanks, Lujean Rave, MSN, RNC-OB Diabetes Coordinator [phone] (8a-5p)

## 2020-10-02 NOTE — Progress Notes (Signed)
Physical Therapy Treatment Patient Details Name: John Nguyen MRN: [id number] DOB: Jul 31, 1956 Today's Date: 10/02/2020    History of Present Illness 64 year old gentleman who is status post revascularization to the right lower extremity and subsequent transmetatarsal amputation. Pt presestns with  extreme pain swelling and progressive gangrenous changes. 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 PMH: Anxiety, depression, DM, HTN, psoriasis, peripheral neuropathy.    PT Comments    Patient progressing towards physical therapy goals with improving endurance for OOB activity. Patient ambulated x25' & x15' with RW and min guard for safety. Performed sit to stand x 3 with supervision and RW. Patient continues to be limited by decreased activity tolerance, generalized weakness, impaired balance, and impaired functional mobility. Continue to recommend comprehensive inpatient rehab (CIR) for post-acute therapy needs.    Follow Up Recommendations  CIR     Equipment Recommendations  Rolling Ausar Georgiou with 5" wheels    Recommendations for Other Services       Precautions / Restrictions Precautions Precautions: Fall Precaution Comments: wound vac Required Braces or Orthoses: Other Brace Other Brace: limb protector Restrictions Weight Bearing Restrictions: Yes RLE Weight Bearing: Non weight bearing    Mobility  Bed Mobility Overal bed mobility: Modified Independent                Transfers Overall transfer level: Needs assistance Equipment used: Rolling Brynleigh Sequeira (2 wheeled) Transfers: Sit to/from Stand Sit to Stand: Supervision         General transfer comment: supervision for safety  Ambulation/Gait Ambulation/Gait assistance: Min guard Gait Distance (Feet): 40 Feet (1 x 25', 1 x 15') Assistive device: Rolling Roney Youtz (2 wheeled) Gait Pattern/deviations: Step-to pattern;Decreased stride length Gait velocity: decreased   General Gait Details:  improved endurance this session and patient stating it didn't tire him out as much   Optometrist    Modified Rankin (Stroke Patients Only)       Balance Overall balance assessment: History of Falls;Needs assistance Sitting-balance support: Feet supported Sitting balance-Leahy Scale: Good     Standing balance support: Bilateral upper extremity supported;During functional activity Standing balance-Leahy Scale: Fair Standing balance comment: fair static standing, but reliant on B UE support for dynamic tasks                            Cognition Arousal/Alertness: Awake/alert Behavior During Therapy: WFL for tasks assessed/performed Overall Cognitive Status: Within Functional Limits for tasks assessed                                        Exercises Amputee Exercises Hip ABduction/ADduction: Right;10 reps Straight Leg Raises: Right;10 reps    General Comments        Pertinent Vitals/Pain Pain Assessment: Faces Faces Pain Scale: Hurts a little bit Pain Location: R LE - discomfort with mobility Pain Descriptors / Indicators: Burning;Grimacing Pain Intervention(s): Monitored during session    Home Living                      Prior Function            PT Goals (current goals can now be found in the care plan section) Acute Rehab PT Goals Patient Stated Goal: to go to rehab PT Goal Formulation: With patient Time  For Goal Achievement: 10/04/20 Progress towards PT goals: Progressing toward goals    Frequency    Min 4X/week      PT Plan Current plan remains appropriate    Co-evaluation              AM-PAC PT "6 Clicks" Mobility   Outcome Measure  Help needed turning from your back to your side while in a flat bed without using bedrails?: None Help needed moving from lying on your back to sitting on the side of a flat bed without using bedrails?: None Help needed moving to and  from a bed to a chair (including a wheelchair)?: A Little Help needed standing up from a chair using your arms (e.g., wheelchair or bedside chair)?: A Little Help needed to walk in hospital room?: A Little Help needed climbing 3-5 steps with a railing? : A Lot 6 Click Score: 19    End of Session Equipment Utilized During Treatment: Gait belt Activity Tolerance: Patient tolerated treatment well Patient left: in bed;with call bell/phone within reach;with bed alarm set Nurse Communication: Mobility status PT Visit Diagnosis: Unsteadiness on feet (R26.81);Muscle weakness (generalized) (M62.81);Difficulty in walking, not elsewhere classified (R26.2);Pain;History of falling (Z91.81) Pain - Right/Left: Right Pain - part of body: Leg     Time: 2836-6294 PT Time Calculation (min) (ACUTE ONLY): 23 min  Charges:  $Therapeutic Activity: 23-37 mins                     Gregor Hams, PT, DPT Acute Rehabilitation Services Pager [phone] Office [phone]    Zannie Kehr Allred 10/02/2020, 2:49 PM

## 2020-10-02 NOTE — Progress Notes (Signed)
Orthopedic Tech Progress Note Patient Details:  Trevyon Swor 23-Jun-1956 [id number] Called in order to [facility] for a BKA VIVE PROTOCOL   Patient ID: Geno Sydnor, male   DOB: [birth year], 64 y.o.   MRN: [id number]   Donald Pore 10/02/2020, 10:44 AM

## 2020-10-02 NOTE — Progress Notes (Signed)
Occupational Therapy Treatment Patient Details Name: John Nguyen MRN: [id number] DOB: Aug 19, 1956 Today's Date: 10/02/2020    History of present illness 64 year old gentleman who is status post revascularization to the right lower extremity and subsequent transmetatarsal amputation. Pt presestns with  extreme pain swelling and progressive gangrenous changes. 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 PMH: Anxiety, depression, DM, HTN, psoriasis, peripheral neuropathy.   OT comments  Pt progressing towards OT goals with improving balance and safety awareness, but still limited by pain and decreased endurance. Pt able to demo mobility to/from bathroom using RW at min guard, no overt LOB noted. Pt completed various grooming tasks seated at sink with Setup assist. Educated pt on safety and compensatory strategies with ADLs including performing tasks seated when possible to decrease fall risk. Plan to provide fall prevention education and further progress ADL balance during next session. Continue to recommend CIR to maximize safety and independence with ADLs, IADLs and mobility.    Follow Up Recommendations  CIR    Equipment Recommendations  3 in 1 bedside commode;Other (comment) (RW)    Recommendations for Other Services Rehab consult    Precautions / Restrictions Precautions Precautions: Fall Precaution Comments: wound vac Required Braces or Orthoses: Other Brace Other Brace: limb protector Restrictions Weight Bearing Restrictions: Yes RLE Weight Bearing: Non weight bearing       Mobility Bed Mobility Overal bed mobility: Modified Independent                Transfers Overall transfer level: Needs assistance Equipment used: Rolling walker (2 wheeled) Transfers: Sit to/from Stand Sit to Stand: Supervision         General transfer comment: supervision for safety    Balance Overall balance assessment: History of Falls;Needs  assistance Sitting-balance support: Feet supported Sitting balance-Leahy Scale: Good     Standing balance support: Bilateral upper extremity supported;During functional activity Standing balance-Leahy Scale: Fair Standing balance comment: fair static standing, but reliant on B UE support for dynamic tasks                           ADL either performed or assessed with clinical judgement   ADL Overall ADL's : Needs assistance/impaired     Grooming: Set up;Sitting Grooming Details (indicate cue type and reason): Setup to wash hair, shave face seated at sink. Able to demo problem solving and improving safety awareness         Upper Body Dressing : Set up;Sitting Upper Body Dressing Details (indicate cue type and reason): Setup to don clean gown                 Functional mobility during ADLs: Min guard;Rolling walker General ADL Comments: Pt with improving balance/safety awareness, but still limited by pain and endurance. Extended time spent discussing home setup vs brother's home setup and DC planning. Educated on safety and compensatory strategies with ADLs, with pt able to demo ADLs seated wtih setup assist. Educated on use of tub bench or shower chair pending DC location.     Vision   Vision Assessment?: No apparent visual deficits   Perception     Praxis      Cognition Arousal/Alertness: Awake/alert Behavior During Therapy: WFL for tasks assessed/performed Overall Cognitive Status: Within Functional Limits for tasks assessed  Exercises     Shoulder Instructions       General Comments Noted with limb protector and shrinkers in room., but pt reports having not worn the shrinker. Donned limb protector for session, removed and positioned knee in extension. Messaged PA for further info regarding shrinker fitting.    Pertinent Vitals/ Pain       Pain Assessment: Faces Faces Pain Scale: Hurts  little more Pain Location: R LE - discomfort with mobility Pain Descriptors / Indicators: Burning;Grimacing Pain Intervention(s): Monitored during session;Premedicated before session;Repositioned  Home Living                                          Prior Functioning/Environment              Frequency  Min 2X/week        Progress Toward Goals  OT Goals(current goals can now be found in the care plan section)  Progress towards OT goals: Progressing toward goals  Acute Rehab OT Goals Patient Stated Goal: to go to rehab OT Goal Formulation: With patient Time For Goal Achievement: 10/11/20 Potential to Achieve Goals: Good ADL Goals Pt Will Perform Lower Body Bathing: with modified independence;sit to/from stand Pt Will Perform Lower Body Dressing: with modified independence;sit to/from stand Pt Will Transfer to Toilet: with modified independence;ambulating;bedside commode Pt Will Perform Toileting - Clothing Manipulation and hygiene: with modified independence;sitting/lateral leans Pt Will Perform Tub/Shower Transfer: with modified independence;tub bench;ambulating Pt/caregiver will Perform Home Exercise Program: Increased strength;Both right and left upper extremity;With written HEP provided;Independently;With theraband Additional ADL Goal #1: Pt will independently verbalie 3 strategies to reduce risk of falls  Plan Discharge plan remains appropriate    Co-evaluation                 AM-PAC OT "6 Clicks" Daily Activity     Outcome Measure   Help from another person eating meals?: None Help from another person taking care of personal grooming?: A Little Help from another person toileting, which includes using toliet, bedpan, or urinal?: A Little Help from another person bathing (including washing, rinsing, drying)?: A Little Help from another person to put on and taking off regular upper body clothing?: A Little Help from another person to put  on and taking off regular lower body clothing?: A Lot 6 Click Score: 18    End of Session Equipment Utilized During Treatment: Gait belt;Rolling walker  OT Visit Diagnosis: Unsteadiness on feet (R26.81);Other abnormalities of gait and mobility (R26.89);Muscle weakness (generalized) (M62.81);History of falling (Z91.81);Pain Pain - Right/Left: Right Pain - part of body: Leg   Activity Tolerance Patient tolerated treatment well   Patient Left in bed;with call bell/phone within reach;with bed alarm set   Nurse Communication Mobility status;Patient requests pain meds;Other (comment) (limb protector, shrinker)        Time: 7001-7494 OT Time Calculation (min): 53 min  Charges: OT General Charges $OT Visit: 1 Visit OT Treatments $Self Care/Home Management : 38-52 mins  Lorre Munroe, OTR/L   Lorre Munroe 10/02/2020, 10:22 AM

## 2020-10-02 NOTE — TOC Progression Note (Signed)
Transition of Care [facility]) - Progression Note    Patient Details  Name: John Nguyen MRN: [id number] Date of Birth: 04-Jan-1956  [facility] CM/SW Contact  Epifanio Lesches, RN Phone Number: 10/02/2020, 8:35 AM  Clinical Narrative:    Per CIR admissions no bed available, SNF workup inplace. Pt without insurance. Jobless. Case discussed with [facility] supervisor. LOG granted for SNF placement. Awaiting SNF bed offers.  TOC will continue to monitor and follow.   Expected Discharge Plan: IP Rehab Facility (vs CIR) Barriers to Discharge: No SNF bed  Expected Discharge Plan and Services Expected Discharge Plan: IP Rehab Facility (vs CIR)   Discharge Planning Services: CM Consult   Living arrangements for the past 2 months: Boarding House Expected Discharge Date: 09/28/20                                     Social Determinants of Health (SDOH) Interventions    Readmission Risk Interventions No flowsheet data found.

## 2020-10-03 LAB — GLUCOSE, CAPILLARY
Glucose-Capillary: 137 mg/dL — ABNORMAL HIGH (ref 70–99)
Glucose-Capillary: 194 mg/dL — ABNORMAL HIGH (ref 70–99)
Glucose-Capillary: 138 mg/dL — ABNORMAL HIGH (ref 70–99)
Glucose-Capillary: 228 mg/dL — ABNORMAL HIGH (ref 70–99)

## 2020-10-03 NOTE — Progress Notes (Addendum)
Physical Therapy Treatment Patient Details Name: John Nguyen MRN: [id number] DOB: 14-Dec-1955 Today's Date: 10/03/2020    History of Present Illness 64 year old gentleman who is status post revascularization to the right lower extremity and subsequent transmetatarsal amputation. Pt presestns with  extreme pain swelling and progressive gangrenous changes. 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 PMH: Anxiety, depression, DM, HTN, psoriasis, peripheral neuropathy.    PT Comments    Pt supine on PTA arrival to room, agreeable to therapy session, pt noted to have R residual limb held in hip ER and knee flexed, pt given instruction on proper positioning at rest in extension and no pillow under limb to prevent contracture, will likely need reinforcement. Pt instructed on phantom pain reduction technique, HEP ([email or web address] Access Code: [email or web address]) and return demo with good technique, emphasis placed on R knee extension and prone hip stretching as detailed below. Pt mostly Supervision for transfers and min guard for gait progression ~85ft total (to/from bathroom ) with RW. Pt continues to benefit from skilled rehab in a post acute setting to maximize functional gains before returning home.   Follow Up Recommendations  CIR     Equipment Recommendations  Rolling walker with 5" wheels    Recommendations for Other Services       Precautions / Restrictions Precautions Precautions: Fall Precaution Comments: wound vac Required Braces or Orthoses: Other Brace Other Brace: limb protector (don for mobility) Restrictions Weight Bearing Restrictions: Yes RLE Weight Bearing: Non weight bearing    Mobility  Bed Mobility Overal bed mobility: Modified Independent             General bed mobility comments: use of bed features  Transfers Overall transfer level: Needs assistance Equipment used: Rolling walker (2 wheeled) Transfers: Sit to/from Stand Sit to  Stand: Supervision         General transfer comment: supervision for safety from EOB and toilet heights, some cues for hand placement needed  Ambulation/Gait Ambulation/Gait assistance: Min guard Gait Distance (Feet): 30 Feet (71ft x2 to/from bathroom) Assistive device: Rolling walker (2 wheeled) Gait Pattern/deviations: Step-to pattern;Decreased stride length ("hop-to" pattern) Gait velocity: decreased   General Gait Details: pt reporting pain better controlled, deferred further as RN planning to perform dressing change   Stairs             Wheelchair Mobility    Modified Rankin (Stroke Patients Only)       Balance Overall balance assessment: History of Falls;Needs assistance Sitting-balance support: Feet supported Sitting balance-Leahy Scale: Good Sitting balance - Comments: no LOB EOB   Standing balance support: Bilateral upper extremity supported;During functional activity Standing balance-Leahy Scale: Poor Standing balance comment: fair static standing with U UE support, but reliant on B UE support for dynamic tasks due to BKA                            Cognition Arousal/Alertness: Awake/alert Behavior During Therapy: WFL for tasks assessed/performed Overall Cognitive Status: Within Functional Limits for tasks assessed                                 General Comments: some safety cues needed with transfers      Exercises Amputee Exercises Hip Extension: AROM;Strengthening;Right;10 reps;Sidelying Hip ABduction/ADduction: AROM;Strengthening;Right;Supine;Sidelying;20 reps (x10 supine, x10 sidelying) Knee Extension: Right;10 reps;Seated Straight Leg Raises: Right;10 reps;AROM Other Exercises Other Exercises: verbal review for prone hip  flexor stretch (towel roll under anterior thigh) and pt given HEP handout he performed as detailed above (.[email or web address] Access Code: [email or web address]) and verbal instruction to perform prone hip  ext and hip flexion stretch TID    General Comments General comments (skin integrity, edema, etc.): pt with limb protector doffed t/o; PA Jarrett Ables messaged for clarification on limb protector wear schedule; post-session, PA replied she would like him to don for OOB mobility, pt notified      Pertinent Vitals/Pain Pain Assessment: Faces Faces Pain Scale: Hurts a little bit Pain Location: R LE - discomfort with mobility Pain Descriptors / Indicators: Burning;Grimacing Pain Intervention(s): Monitored during session;Premedicated before session;Repositioned (pt reports pain well controlled this date)  VSS per chart review and pt w/o acute s/sx distress, not further assessed.  Home Living                      Prior Function            PT Goals (current goals can now be found in the care plan section) Acute Rehab PT Goals Patient Stated Goal: to go to rehab PT Goal Formulation: With patient Time For Goal Achievement: 10/04/20 Progress towards PT goals: Progressing toward goals    Frequency    Min 4X/week      PT Plan Current plan remains appropriate    Co-evaluation              AM-PAC PT "6 Clicks" Mobility   Outcome Measure  Help needed turning from your back to your side while in a flat bed without using bedrails?: None Help needed moving from lying on your back to sitting on the side of a flat bed without using bedrails?: None Help needed moving to and from a bed to a chair (including a wheelchair)?: A Little Help needed standing up from a chair using your arms (e.g., wheelchair or bedside chair)?: A Little Help needed to walk in hospital room?: A Little Help needed climbing 3-5 steps with a railing? : A Lot 6 Click Score: 19    End of Session Equipment Utilized During Treatment: Gait belt Activity Tolerance: Patient tolerated treatment well Patient left: in bed;with call bell/phone within reach;with bed alarm set Nurse Communication: Mobility  status PT Visit Diagnosis: Unsteadiness on feet (R26.81);Muscle weakness (generalized) (M62.81);Difficulty in walking, not elsewhere classified (R26.2);Pain;History of falling (Z91.81) Pain - Right/Left: Right Pain - part of body: Leg     Time: 1205-1229 PT Time Calculation (min) (ACUTE ONLY): 24 min  Charges:  $Gait Training: 8-22 mins $Therapeutic Exercise: 8-22 mins                     Hannah Crill P., PTA Acute Rehabilitation Services Pager: [phone] Office: [phone]   Angus Palms 10/03/2020, 12:57 PM

## 2020-10-03 NOTE — Progress Notes (Signed)
Inpatient Rehab Admissions Coordinator:   Pt continues to progress well with PT/OT and we continue to have no bed availability for him.  Will sign off for CIR at this time.   Estill Dooms, PT, DPT Admissions Coordinator [phone] 10/03/20  10:19 AM

## 2020-10-03 NOTE — Plan of Care (Signed)

## 2020-10-03 NOTE — TOC Progression Note (Addendum)
Transition of Care [facility]) - Progression Note    Patient Details  Name: John Nguyen MRN: [id number] Date of Birth: 16-Jun-1956  Transition of Care [facility]) CM/SW Contact  Epifanio Lesches, RN Phone Number: 10/03/2020, 1:20 PM  Clinical Narrative:    Pt without SNF bed noted .... search expanded. NCM made referral to [facility] for backup  home health services , charity case, if needed. Approval pending.  TOC team will continue to monitor and follow....  10/03/2020 NCM f/u with financial counselor to learn status of financial screening, voice message left and email sent.  Expected Discharge Plan: Skilled Nursing Facility (vs home health) Barriers to Discharge: No SNF bed  Expected Discharge Plan and Services Expected Discharge Plan: Skilled Nursing Facility (vs home health)   Discharge Planning Services: CM Consult   Living arrangements for the past 2 months: Boarding House Expected Discharge Date: 09/28/20                                     Social Determinants of Health (SDOH) Interventions    Readmission Risk Interventions No flowsheet data found.

## 2020-10-03 NOTE — Progress Notes (Signed)
Occupational Therapy Treatment Patient Details Name: John Nguyen MRN: [id number] DOB: 07-10-1956 Today's Date: 10/03/2020    History of present illness 64 year old gentleman who is status post revascularization to the right lower extremity and subsequent transmetatarsal amputation. Pt presestns with  extreme pain swelling and progressive gangrenous changes. 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 PMH: Anxiety, depression, DM, HTN, psoriasis, peripheral neuropathy.   OT comments  Pt progressing towards acute OT goals. Focus of session was fall prevention education and establishing a HEP for general BUE strengthening utilizing level 1 theraband. Pt reports recent dressing change and politely declined OOB at this moment 2/2 pain and "letting that settle". D/c recommendation remains appropriate from OT standpoint.    Follow Up Recommendations  CIR    Equipment Recommendations  3 in 1 bedside commode    Recommendations for Other Services      Precautions / Restrictions Precautions Precautions: Fall Precaution Comments: wound vac Required Braces or Orthoses: Other Brace Other Brace: limb protector, don for mobility Restrictions Weight Bearing Restrictions: Yes RLE Weight Bearing: Non weight bearing       Mobility Bed Mobility Overal bed mobility: Modified Independent             General bed mobility comments: use of bed features  Transfers Overall transfer level: Needs assistance Equipment used: Rolling walker (2 wheeled) Transfers: Sit to/from Stand Sit to Stand: Supervision         General transfer comment: declined OOB at this time due to increased pain from recent dressing change    Balance Overall balance assessment: History of Falls;Needs assistance Sitting-balance support: Feet supported Sitting balance-Leahy Scale: Good Sitting balance - Comments: no LOB EOB   Standing balance support: Bilateral upper extremity supported;During  functional activity Standing balance-Leahy Scale: Poor Standing balance comment: fair static standing with U UE support, but reliant on B UE support for dynamic tasks due to BKA                           ADL either performed or assessed with clinical judgement   ADL                                         General ADL Comments: Fall prevention education and established HEP for BUE strengthening. Pt able to independently  come to long sitting position     Vision       Perception     Praxis      Cognition Arousal/Alertness: Awake/alert Behavior During Therapy: WFL for tasks assessed/performed Overall Cognitive Status: Within Functional Limits for tasks assessed                                 General Comments: some safety cues needed with transfers        Exercises Exercises: Amputee Amputee Exercises Hip Extension: AROM;Strengthening;Right;10 reps;Sidelying Hip ABduction/ADduction: AROM;Strengthening;Right;Supine;Sidelying;20 reps (x10 supine, x10 sidelying) Knee Extension: Right;10 reps;Seated Straight Leg Raises: Right;10 reps;AROM Other Exercises Other Exercises: theraband level 1 with written HEP provided   Shoulder Instructions       General Comments pt with limb protector doffed t/o; PA Jarrett Ables messaged for clarification on limb protector wear schedule; post-session, PA replied she would like him to don for OOB mobility, pt notified  Pertinent Vitals/ Pain       Pain Assessment: Faces Faces Pain Scale: Hurts little more Pain Location: RLE - recent dressing change Pain Descriptors / Indicators: Burning;Grimacing Pain Intervention(s): Monitored during session;Limited activity within patient's tolerance  Home Living                                          Prior Functioning/Environment              Frequency  Min 2X/week        Progress Toward Goals  OT Goals(current goals can now  be found in the care plan section)  Progress towards OT goals: Progressing toward goals  Acute Rehab OT Goals Patient Stated Goal: to go to rehab OT Goal Formulation: With patient Time For Goal Achievement: 10/11/20 Potential to Achieve Goals: Good ADL Goals Pt Will Perform Lower Body Bathing: with modified independence;sit to/from stand Pt Will Perform Lower Body Dressing: with modified independence;sit to/from stand Pt Will Transfer to Toilet: with modified independence;ambulating;bedside commode Pt Will Perform Toileting - Clothing Manipulation and hygiene: with modified independence;sitting/lateral leans Pt Will Perform Tub/Shower Transfer: with modified independence;tub bench;ambulating Pt/caregiver will Perform Home Exercise Program: Increased strength;Both right and left upper extremity;With written HEP provided;Independently;With theraband Additional ADL Goal #1: Pt will independently verbalie 3 strategies to reduce risk of falls  Plan Discharge plan remains appropriate    Co-evaluation                 AM-PAC OT "6 Clicks" Daily Activity     Outcome Measure   Help from another person eating meals?: None Help from another person taking care of personal grooming?: None Help from another person toileting, which includes using toliet, bedpan, or urinal?: A Little Help from another person bathing (including washing, rinsing, drying)?: A Little Help from another person to put on and taking off regular upper body clothing?: A Little Help from another person to put on and taking off regular lower body clothing?: A Lot 6 Click Score: 19    End of Session    OT Visit Diagnosis: Unsteadiness on feet (R26.81);Other abnormalities of gait and mobility (R26.89);Muscle weakness (generalized) (M62.81);History of falling (Z91.81);Pain Pain - Right/Left: Right Pain - part of body: Leg   Activity Tolerance Patient limited by pain;Patient tolerated treatment well   Patient Left in  bed;with call bell/phone within reach;with bed alarm set   Nurse Communication          Time: 5009-3818 OT Time Calculation (min): 10 min  Charges: OT General Charges $OT Visit: 1 Visit OT Treatments $Therapeutic Exercise: 8-22 mins  Raynald Kemp, OT Acute Rehabilitation Services Pager: [phone] Office: [phone]    Pilar Grammes 10/03/2020, 2:09 PM

## 2020-10-03 NOTE — Progress Notes (Signed)
Is postop day 5 status post below-knee amputation.  He is doing well.  He is awaiting a nursing home bed.   Vital signs stable afebrile wound VAC is in place has 1 green check.   Have placed an order for removal of the wound VAC by nursing today.  Patient asked that this be done a bit later as he did not sleep well last night and I think this is fine also placed order for daily dry dressing change.  Plan for discharge to skilled nursing hopefully today or tomorrow if bed is available

## 2020-10-04 ENCOUNTER — Other Ambulatory Visit (HOSPITAL_COMMUNITY): Payer: Self-pay | Admitting: Physician Assistant

## 2020-10-04 LAB — GLUCOSE, CAPILLARY
Glucose-Capillary: 184 mg/dL — ABNORMAL HIGH (ref 70–99)
Glucose-Capillary: 156 mg/dL — ABNORMAL HIGH (ref 70–99)

## 2020-10-04 MED FILL — OXYCODONE-APAP 5-325MG: 5-325 | 5 days supply | Qty: 30 | Fill #0

## 2020-10-04 NOTE — Discharge Summary (Signed)
Discharge Diagnoses:  Active Problems:   Gangrene of right foot (HCC)   Osteomyelitis (HCC)   Surgeries: Procedure(s): RIGHT AMPUTATION BELOW KNEE on 09/26/2020    Consultants:   Discharged Condition: Improved  Hospital Course: John Nguyen is an 64 y.o. male who was admitted 09/26/2020 with a chief complaint of right foot osteomyelitis, with a final diagnosis of gangrene right transmetatarsal amputation.  Patient was brought to the operating room on 09/26/2020 and underwent Procedure(s): RIGHT AMPUTATION BELOW KNEE.    Patient was given perioperative antibiotics:  Anti-infectives (From admission, onward)   Start     Dose/Rate Route Frequency Ordered Stop   09/26/20 1700  ceFAZolin (ANCEF) IVPB 2g/100 mL premix        2 g 200 mL/hr over 30 Minutes Intravenous Every 6 hours 09/26/20 1518 09/27/20 0523   09/26/20 0915  ceFAZolin (ANCEF) IVPB 2g/100 mL premix        2 g 200 mL/hr over 30 Minutes Intravenous On call to O.R. 09/26/20 0902 09/26/20 1144    .  Patient was given sequential compression devices, early ambulation, and aspirin for DVT prophylaxis.  Recent vital signs:  Patient Vitals for the past 24 hrs:  BP Temp Temp src Pulse Resp SpO2  10/04/20 0722 127/71 98.5 F (36.9 C) Oral 72 16 99 %  10/04/20 0512 128/70 98.3 F (36.8 C) Oral 70 16 99 %  10/03/20 2129 121/68 98.4 F (36.9 C) Oral 77 16 100 %  10/03/20 1500 126/72 98.3 F (36.8 C) Oral 77 18 99 %  .  Recent laboratory studies: No results found.  Discharge Medications:   Allergies as of 10/04/2020      Reactions   Cymbalta [duloxetine Hcl] Rash      Medication List    STOP taking these medications   doxycycline 100 MG tablet Commonly known as: VIBRA-TABS   gabapentin 100 MG capsule Commonly known as: NEURONTIN   nitroGLYCERIN 0.2 mg/hr patch Commonly known as: NITRODUR - Dosed in mg/24 hr     TAKE these medications   aspirin 81 MG EC tablet Take 1 tablet (81 mg total) by mouth daily.  Swallow whole.   atorvastatin 80 MG tablet Commonly known as: LIPITOR Take 1 tablet (80 mg total) by mouth daily.   clopidogrel 75 MG tablet Commonly known as: PLAVIX Take 1 tablet (75 mg total) by mouth daily with breakfast.   diphenhydrAMINE 25 mg capsule Commonly known as: BENADRYL Take 25 mg by mouth daily.   insulin NPH Human 100 UNIT/ML injection Commonly known as: NOVOLIN N Inject 0.12 mLs (12 Units total) into the skin 2 (two) times daily. What changed:   how much to take  when to take this  additional instructions   lisinopril 5 MG tablet Commonly known as: ZESTRIL Take 1 tablet (5 mg total) by mouth daily.   multivitamin with minerals Tabs tablet Take 1 tablet by mouth daily.   oxyCODONE-acetaminophen 5-325 MG tablet Commonly known as: Percocet Take 1 tablet by mouth every 4 (four) hours as needed. What changed: reasons to take this   polyethylene glycol 17 g packet Commonly known as: MIRALAX / GLYCOLAX Take 17 g by mouth daily.       Diagnostic Studies: VAS [place] ABI WITH/WO TBI  Result Date: 09/20/2020 LOWER EXTREMITY DOPPLER STUDY Indications: Peripheral artery disease. High Risk Factors: Hypertension, Diabetes.  Vascular Interventions: 08/08/2020: Abdom. aortogram w/ LE. Rt pop stent and  angioplasty of rt tibioperoneal trunk and peroneal                         artery.                          Right foot toes amputated. Comparison Study: None Performing Technologist: Ethelle Lyon  Examination Guidelines: A complete evaluation includes at minimum, Doppler waveform signals and systolic blood pressure reading at the level of bilateral brachial, anterior tibial, and posterior tibial arteries, when vessel segments are accessible. Bilateral testing is considered an integral part of a complete examination. Photoelectric Plethysmograph (PPG) waveforms and toe systolic pressure readings are included as required and additional duplex testing as  needed. Limited examinations for reoccurring indications may be performed as noted.  ABI Findings: +---------+------------------+-----+--------+---------+ Right    Rt Pressure (mmHg)IndexWaveformComment   +---------+------------------+-----+--------+---------+ Brachial 168                                      +---------+------------------+-----+--------+---------+ PTA      235               1.40 biphasic          +---------+------------------+-----+--------+---------+ DP       220               1.31 biphasic          +---------+------------------+-----+--------+---------+ Great Toe                               amputated +---------+------------------+-----+--------+---------+ +---------+------------------+-----+--------+-------+ Left     Lt Pressure (mmHg)IndexWaveformComment +---------+------------------+-----+--------+-------+ Brachial 154                                    +---------+------------------+-----+--------+-------+ PTA      201               1.20 biphasic        +---------+------------------+-----+--------+-------+ DP       76                0.45 biphasic        +---------+------------------+-----+--------+-------+ Great Toe60                0.36                 +---------+------------------+-----+--------+-------+ +-------+-----------+-----------+------------+------------+ ABI/TBIToday's ABIToday's TBIPrevious ABIPrevious TBI +-------+-----------+-----------+------------+------------+ Right  1.40       NA         0.71        NA           +-------+-----------+-----------+------------+------------+ Left   1.20       0.36       0.96        0.16         +-------+-----------+-----------+------------+------------+  Arterial wall calcification precludes accurate ankle pressures and ABIs.  Summary: Right: Resting right ankle-brachial index indicates noncompressible right lower extremity arteries. Left: Resting left ankle-brachial  index is within normal range. No evidence of significant left lower extremity arterial disease. The left toe-brachial index is abnormal. ABIs are unreliable.  *See table(s) above for measurements and observations.  Electronically signed by Fabienne Bruns MD on 09/20/2020 at 4:24:41 PM.    Final  VAS [place] LOWER EXTREMITY ARTERIAL DUPLEX  Result Date: 09/20/2020 LOWER EXTREMITY ARTERIAL DUPLEX STUDY Indications: Peripheral artery disease. High Risk Factors: Hypertension, Diabetes.  Vascular Interventions: 08/08/2020: Abdom. aortogram w/ LE. Rt pop stent and                         angioplasty of rt tibioperoneal trunk and peroneal                         artery.                          Right foot toes amputated. Current ABI:            ABI's unreliable due to calcification Comparison Study: None Performing Technologist: Ethelle Lyon  Examination Guidelines: A complete evaluation includes B-mode imaging, spectral Doppler, color Doppler, and power Doppler as needed of all accessible portions of each vessel. Bilateral testing is considered an integral part of a complete examination. Limited examinations for reoccurring indications may be performed as noted.  +-----------+--------+-----+--------+---------+--------+ RIGHT      PSV cm/sRatioStenosisWaveform Comments +-----------+--------+-----+--------+---------+--------+ CFA Prox   133                  triphasic         +-----------+--------+-----+--------+---------+--------+ DFA        115                  biphasic          +-----------+--------+-----+--------+---------+--------+ SFA Prox   98                   triphasic         +-----------+--------+-----+--------+---------+--------+ SFA Mid    80                   biphasic          +-----------+--------+-----+--------+---------+--------+ SFA Distal 58                   biphasic          +-----------+--------+-----+--------+---------+--------+ POP Prox                                  stent    +-----------+--------+-----+--------+---------+--------+ ATA Distal 45                   biphasic          +-----------+--------+-----+--------+---------+--------+ PTA Distal 34                   biphasic          +-----------+--------+-----+--------+---------+--------+ PERO Distal132                  biphasic          +-----------+--------+-----+--------+---------+--------+  Right Stent(s): +---------------+--------+--------+---------+--------+ Popliteal      PSV cm/sStenosisWaveform Comments +---------------+--------+--------+---------+--------+ Prox to Stent  53              biphasic          +---------------+--------+--------+---------+--------+ Proximal Stent 53              triphasic         +---------------+--------+--------+---------+--------+ Mid Stent      43              triphasic         +---------------+--------+--------+---------+--------+  Distal Stent   52              triphasic         +---------------+--------+--------+---------+--------+ Distal to Stent33              triphasic         +---------------+--------+--------+---------+--------+     Summary:  Right: Patent right popliteal stent with no evidence of stenosis.  See table(s) above for measurements and observations. Electronically signed by Fabienne Brunsharles Fields MD on 09/20/2020 at 4:17:51 PM.    Final     Patient benefited maximally from their hospital stay and there were no complications.     Disposition: Discharge disposition: 01-Home or Self Care      Discharge Instructions    Call MD / Call 911   Complete by: As directed    If you experience chest pain or shortness of breath, CALL 911 and be transported to the hospital emergency room.  If you develope a fever above 101 F, pus (white drainage) or increased drainage or redness at the wound, or calf pain, call your surgeon's office.   Call MD / Call 911   Complete by: As directed    If you experience  chest pain or shortness of breath, CALL 911 and be transported to the hospital emergency room.  If you develope a fever above 101 F, pus (white drainage) or increased drainage or redness at the wound, or calf pain, call your surgeon's office.   Constipation Prevention   Complete by: As directed    Drink plenty of fluids.  Prune juice may be helpful.  You may use a stool softener, such as Colace (over the counter) 100 mg twice a day.  Use MiraLax (over the counter) for constipation as needed.   Constipation Prevention   Complete by: As directed    Drink plenty of fluids.  Prune juice may be helpful.  You may use a stool softener, such as Colace (over the counter) 100 mg twice a day.  Use MiraLax (over the counter) for constipation as needed.   Diet - low sodium heart healthy   Complete by: As directed    Diet - low sodium heart healthy   Complete by: As directed    Increase activity slowly as tolerated   Complete by: As directed    Increase activity slowly as tolerated   Complete by: As directed       Follow-up Information    Nadim Malia, [place] In 1 week.   Specialty: Orthopedic Surgery Contact information: [address] [place] [address] [phone]                Signed: [place] Azile Minardi 10/04/2020, 7:34 AM

## 2020-10-04 NOTE — Progress Notes (Cosign Needed)
    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  A rolling walker will not resolve  issue with performing activities of daily living. A wheelchair will allow patient to safely perform daily activities. Patient is not able to propel themselves in the home using a standard weight wheelchair due to weakness. Patient can self propel in the lightweight wheelchair. Length of need 12 months. Accessories: elevating leg rests (ELRs), wheel locks, extensions and anti-tippers.   10/04/20 1002   10/04/20 0959  For home use only DME Walker rolling  Once       Question Answer Comment  Walker: With 5 Inch Wheels   Patient needs a walker to treat with the following condition Weakness      10/04/20 1002

## 2020-10-04 NOTE — Plan of Care (Signed)

## 2020-10-04 NOTE — Progress Notes (Signed)
Physical Therapy Treatment Patient Details Name: John Nguyen MRN: [id number] DOB: 1956/08/01 Today's Date: 10/04/2020    History of Present Illness 64 year old gentleman who is status post revascularization to the right lower extremity and subsequent transmetatarsal amputation. Pt presestns with  extreme pain swelling and progressive gangrenous changes. 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 PMH: Anxiety, depression, DM, HTN, psoriasis, peripheral neuropathy.    PT Comments    Pt supine on arrival, agreeable to therapy session with encouragement, agreeable to exercises/gait training however pt defers stair training at this time. Pt reports plan for posterior boosting up stairs with family assist if needing to perform steps, although reports he plan to DC to brother's home initially where he will likely not need to perform steps. Pt able to progress gait distance to 52ft with mostly Supervision and Supervision for transfers with occasional cues needed for safety. Reinforced positioning in bed, educated on signs/symptoms of infection and likely progression of therapies/preparation for prosthesis and importance of attending follow-up appointments for proper monitoring/limb shaping. Pt continues to benefit from PT services to progress toward functional mobility goals. D/C recs below, equipment and DC recs updated per pt likely DC home per discussion with case mgmt and supervising PT Alayna A.   Follow Up Recommendations  CIR (however if home, HHPT would be beneficial)     Equipment Recommendations  Rolling walker with 5" wheels;Wheelchair (measurements PT);Wheelchair cushion (measurements PT)    Recommendations for Other Services       Precautions / Restrictions Precautions Precautions: Fall Required Braces or Orthoses: Other Brace Other Brace: limb protector (don for mobility) Restrictions Weight Bearing Restrictions: Yes (Simultaneous filing. User may not have  seen previous data.) RLE Weight Bearing: Non weight bearing (Simultaneous filing. User may not have seen previous data.)    Mobility  Bed Mobility Overal bed mobility: Modified Independent             General bed mobility comments: use of bed features  Transfers Overall transfer level: Needs assistance Equipment used: Rolling walker (2 wheeled) Transfers: Sit to/from Stand Sit to Stand: Supervision         General transfer comment: supervision for safety from EOB some cues for hand placement needed with fair carryover  Ambulation/Gait Ambulation/Gait assistance: Supervision Gait Distance (Feet): 80 Feet (86ft, seated break, 54ft) Assistive device: Rolling walker (2 wheeled) Gait Pattern/deviations: Step-to pattern;Decreased stride length ("hop-to" pattern) Gait velocity: decreased   General Gait Details: cues for press and sway technique rather than up and down hopping to decrease fatigue and shoulder hunching, RW lowered 2 clicks and pt reporting easier to perform   Stairs             Wheelchair Mobility    Modified Rankin (Stroke Patients Only)       Balance Overall balance assessment: History of Falls;Needs assistance Sitting-balance support: Feet supported Sitting balance-Leahy Scale: Good Sitting balance - Comments: no LOB EOB   Standing balance support: Bilateral upper extremity supported;During functional activity Standing balance-Leahy Scale: Poor Standing balance comment: fair static standing with U UE support, but reliant on B UE support for dynamic tasks due to BKA                            Cognition Arousal/Alertness: Awake/alert Behavior During Therapy: WFL for tasks assessed/performed Overall Cognitive Status: Within Functional Limits for tasks assessed  General Comments: some safety cues needed with transfers      Exercises Amputee Exercises Hip Extension:  AROM;Strengthening;Right;10 reps;Sidelying Hip ABduction/ADduction: AROM;Strengthening;Right;Supine;10 reps Knee Extension: Right;10 reps;Supine Straight Leg Raises: Right;10 reps;AROM    General Comments General comments (skin integrity, edema, etc.): pt given instruction for donning shrinker (no wrinkles, make sure pulled all the way up/not loose around bottom of limb) and pt able to don limb guard and shrinker with supervision/occasional cues      Pertinent Vitals/Pain Pain Assessment: Faces Faces Pain Scale: Hurts little more Pain Location: R LE - discomfort with mobility Pain Descriptors / Indicators: Burning;Grimacing Pain Intervention(s): Monitored during session;Premedicated before session;Repositioned  HR 110-123 bpm during mobility, SpO2 97% on RA, no dizziness reported and BP not further assessed.  Home Living                      Prior Function            PT Goals (current goals can now be found in the care plan section) Acute Rehab PT Goals Patient Stated Goal: to go to rehab or have rehab at home if possible (no insurance) PT Goal Formulation: With patient Time For Goal Achievement: 10/04/20 Progress towards PT goals: Progressing toward goals    Frequency    Min 4X/week      PT Plan Current plan remains appropriate    Co-evaluation              AM-PAC PT "6 Clicks" Mobility   Outcome Measure  Help needed turning from your back to your side while in a flat bed without using bedrails?: None Help needed moving from lying on your back to sitting on the side of a flat bed without using bedrails?: None Help needed moving to and from a bed to a chair (including a wheelchair)?: A Little Help needed standing up from a chair using your arms (e.g., wheelchair or bedside chair)?: A Little Help needed to walk in hospital room?: A Little Help needed climbing 3-5 steps with a railing? : A Lot 6 Click Score: 19    End of Session Equipment Utilized  During Treatment: Gait belt Activity Tolerance: Patient tolerated treatment well Patient left: in bed;with call bell/phone within reach;with bed alarm set Nurse Communication: Mobility status PT Visit Diagnosis: Unsteadiness on feet (R26.81);Muscle weakness (generalized) (M62.81);Difficulty in walking, not elsewhere classified (R26.2);Pain;History of falling (Z91.81) Pain - Right/Left: Right Pain - part of body: Leg     Time: 0939-1010 PT Time Calculation (min) (ACUTE ONLY): 31 min  Charges:  $Gait Training: 8-22 mins $Therapeutic Exercise: 8-22 mins                     Judine Arciniega P., PTA Acute Rehabilitation Services Pager: [phone] Office: [phone]   [place] 10/04/2020, 10:23 AM

## 2020-10-04 NOTE — TOC Transition Note (Signed)
Transition of Care [facility]) - CM/SW Discharge Note   Patient Details  Name: John Nguyen MRN: [id number] Date of Birth: 11-Jul-1956  [facility] of [facility]) CM/SW Contact:  Epifanio Lesches, RN Phone Number: 10/04/2020, 12:16 PM   Clinical Narrative:    Patient will DC to: home Anticipated DC date: 10/04/2020 Family notified: brother Transport by: car   Per MD patient ready for DC to . RN, patient, and patient's brother and facility notified of DC. Pt will received  Home health charity care ( PT) from [facility] .Brother's address: [address] [place] [facility]. DME will be delivered to beside prior to d/c.  [facility] pharmacy will deliver Rx med to bedside prior to d/c.   [facility])       [phone]      RNCM will sign off for now as intervention is no longer needed. Please consult [place] again if new needs arise.   Final next level of care: Home w Home Health Services Barriers to Discharge: No Barriers Identified   Patient Goals and CMS Choice     Choice offered to / list presented to : Patient (pt without insurance, charity case)  Discharge Placement                       Discharge Plan and Services   Discharge Planning Services: CM Consult            DME Arranged: Community education officer wheelchair with seat cushion,Walker rolling DME Agency: [facility] case) Date DME Agency Contacted: 10/04/20 Time DME Agency Contacted: 1043 Representative spoke with at DME Agency: Olegario Messier HH Arranged: PT HH Agency: [facility] (Charity case) Date HH Agency Contacted: 10/04/20 Time HH Agency Contacted: 1045 Representative spoke with at [facility] Agency: [place]  Social Determinants of Health (SDOH) Interventions     Readmission Risk Interventions No flowsheet data found.

## 2020-10-04 NOTE — Progress Notes (Signed)
Provided discharge education/instructions, all questions and concerns addressed, Pt not in distress. TOC pharmacy delivered meds to room, educated on keeping the dressing clean and dry.  Pt discharged home with belongings, wheelchair, RW and crutches accompanied by his brother.

## 2020-10-04 NOTE — Progress Notes (Signed)
Patient is status post below-knee amputation.  He is doing well and progressing with physical therapy.    Vital signs stable afebrile alert pleasant to exam.  VAC was removed yesterday currently has dry dressing which is clean dry in place.   Discussed with patient that he may have to be discharged today with home health rather than to skilled nursing.  Because of his insurance issue.  Asked that he contact his brother.  We will write discharge orders for today with home health follow-up in our office in 1 week   If any questions or concerns please contact [facility] on-call service through Sunday

## 2020-10-08 ENCOUNTER — Ambulatory Visit (INDEPENDENT_AMBULATORY_CARE_PROVIDER_SITE_OTHER): Payer: Self-pay | Admitting: Physician Assistant

## 2020-10-08 ENCOUNTER — Other Ambulatory Visit: Payer: Self-pay

## 2020-10-08 ENCOUNTER — Encounter: Payer: Self-pay | Admitting: Physician Assistant

## 2020-10-08 VITALS — Ht 69.0 in | Wt 182.0 lb

## 2020-10-08 DIAGNOSIS — I96 Gangrene, not elsewhere classified: Secondary | ICD-10-CM

## 2020-10-08 NOTE — Progress Notes (Signed)
Office Visit Note   Patient: John Nguyen           Date of Birth: 1956-06-30           MRN: [id number] Visit Date: 10/08/2020              Requested by: No referring provider defined for this encounter. PCP: Pcp, No  Chief Complaint  Patient presents with  . 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  He is currently staying with his brother.  He overall is doing well and is stated he is making good protein intake.  He did unfortunately have a fall onto the stump last night so it is sore today.  Assessment & Plan: Visit Diagnoses: No diagnosis found.  Plan: Begin daily cleansing with Dial soap.  Continue to work on range of motion follow-up in 1 week  Follow-Up Instructions: No follow-ups on file.   Ortho Exam  Patient is alert, oriented, no adenopathy, well-dressed, normal affect, normal respiratory effort. He has well apposed wound edges without any necrosis.  He does have some bleeding.  He also has bruising at the end of the stump where he fell on it.  He has difficulty extending his knee today because of pain.  No cellulitis or signs of infection  Imaging: No results found. No images are attached to the encounter.  Labs: Lab Results  Component Value Date   HGBA1C 8.2 (H) 08/06/2020   REPTSTATUS 08/11/2020 FINAL 08/06/2020   REPTSTATUS 08/11/2020 FINAL 08/06/2020   CULT  08/06/2020    NO GROWTH 5 DAYS Performed at [facility], [address], [place], [place] [address]    CULT  08/06/2020    NO GROWTH 5 DAYS Performed at [facility] Lab, [address], [place], [place] [address]      Lab Results  Component Value Date   ALBUMIN 3.8 08/06/2020   ALBUMIN 3.8 08/20/2009    Lab Results  Component Value Date   MG 2.0 08/09/2020   No results found for: VD25OH  No results found for: PREALBUMIN CBC EXTENDED Latest Ref Rng & Units  09/26/2020 08/24/2020 08/09/2020  WBC 4.0 - 10.5 K/uL 9.5 - 11.5(H)  RBC 4.22 - 5.81 MIL/uL 4.61 - 4.81  HGB 13.0 - 17.0 g/dL 48.0 16.5 53.7  HCT 48.2 - 52.0 % 41.3 50.0 44.1  PLT 150 - 400 K/uL 358 - 309  NEUTROABS 1.7 - 7.7 K/uL - - -  LYMPHSABS 0.7 - 4.0 K/uL - - -     Body mass index is 26.88 kg/m.  Orders:  No orders of the defined types were placed in this encounter.  No orders of the defined types were placed in this encounter.    Procedures: No procedures performed  Clinical Data: No additional findings.  ROS:  All other systems negative, except as noted in the HPI. Review of Systems  Objective: Vital Signs: Ht 5\' 9"  (1.753 m)   Wt 182 lb (82.6 kg)   BMI 26.88 kg/m   Specialty Comments:  No specialty comments available.  PMFS History: Patient Active Problem List   Diagnosis Date Noted  . Osteomyelitis (HCC) 09/26/2020  . Osteomyelitis of foot, right, acute (HCC)   . Gangrene of right foot (HCC)   . PVD (peripheral vascular disease) (HCC)   . Cellulitis 08/06/2020  . Diabetes mellitus with  peripheral vascular disease (HCC) 08/06/2020  . Psoriasis 08/06/2020  . Depression 08/06/2020   Past Medical History:  Diagnosis Date  . Anxiety   . Depression   . Diabetes mellitus without complication (HCC)   . Hypertension   . Psoriasis   . Type 1 diabetes mellitus (HCC)     Family History  Problem Relation Age of Onset  . Diabetes Mother   . Cancer Mother   . COPD Father     Past Surgical History:  Procedure Laterality Date  . ABDOMINAL AORTOGRAM W/LOWER EXTREMITY N/A 08/08/2020   Procedure: ABDOMINAL AORTOGRAM W/LOWER EXTREMITY;  Surgeon: Nada Libman, MD;  [facility];  Service: Cardiovascular;  Laterality: N/A;  . AMPUTATION Right 08/24/2020   Procedure: RIGHT TRANSMETATARSAL AMPUTATION;  Surgeon: Nadara Mustard, MD;  Location: [facility] OR;  Service: Orthopedics;  Laterality: Right;  . AMPUTATION Right 09/26/2020   Procedure:  RIGHT AMPUTATION BELOW KNEE;  Surgeon: Nadara Mustard, MD;  Location: [facility] OR;  Service: Orthopedics;  Laterality: Right;  . arm fracture surgery Left   . PERIPHERAL VASCULAR ATHERECTOMY  08/08/2020   Procedure: PERIPHERAL VASCULAR ATHERECTOMY;  Surgeon: Nada Libman, MD;  [facility];  Service: Cardiovascular;;  . PERIPHERAL VASCULAR INTERVENTION  08/08/2020   Procedure: PERIPHERAL VASCULAR INTERVENTION;  Surgeon: Nada Libman, MD;  [facility];  Service: Cardiovascular;;   Social History   Occupational History  . Not on file  Tobacco Use  . Smoking status: Never Smoker  . Smokeless tobacco: Never Used  Vaping Use  . Vaping Use: Former  Substance and Sexual Activity  . Alcohol use: Not Currently    Comment: stopped drinking about amonth ago as of 11/12/  . Drug use: Not Currently    Types: Marijuana  . Sexual activity: Not on file

## 2020-10-11 ENCOUNTER — Other Ambulatory Visit: Payer: Self-pay | Admitting: Physician Assistant

## 2020-10-11 ENCOUNTER — Telehealth: Payer: Self-pay | Admitting: Orthopedic Surgery

## 2020-10-11 MED ORDER — OXYCODONE-ACETAMINOPHEN 5-325 MG PO TABS: 1.0000 | ORAL_TABLET | Freq: Four times a day (QID) | ORAL | 0 refills | Status: DC | PRN

## 2020-10-11 NOTE — Telephone Encounter (Signed)
I called pt and lm on vm to advise rx has been sent to pharm. To call with any questions.  

## 2020-10-11 NOTE — Telephone Encounter (Signed)
Pt called and would like a refill on oxycodone  

## 2020-10-11 NOTE — Telephone Encounter (Signed)
refilled 

## 2020-10-11 NOTE — Telephone Encounter (Signed)
09/26/20 right BKA please see message below.

## 2020-10-16 ENCOUNTER — Ambulatory Visit (INDEPENDENT_AMBULATORY_CARE_PROVIDER_SITE_OTHER): Payer: Self-pay | Admitting: Physician Assistant

## 2020-10-16 ENCOUNTER — Encounter: Payer: Self-pay | Admitting: Physician Assistant

## 2020-10-16 DIAGNOSIS — I96 Gangrene, not elsewhere classified: Secondary | ICD-10-CM

## 2020-10-16 NOTE — Progress Notes (Signed)
Office Visit Note   Patient: John Nguyen           Date of Birth: 10/06/1956           MRN: [id number] Visit Date: 10/16/2020              Requested by: No referring provider defined for this encounter. PCP: Pcp, No  Chief Complaint  Patient presents with  . Right Leg - Routine Post Op      HPI: Patient is 2 weeks status post right below-knee amputation.  He has been doing daily dressing changes.  Assessment & Plan: Visit Diagnoses: No diagnosis found.  Plan: I would like for him to get a 2 XL shrinker I think the one he has currently is way too big.  Continue daily dressing changes when he can tolerate I would prefer that he just placed shrinker against the skin.  Follow-Up Instructions: No follow-ups on file.   Ortho Exam  Patient is alert, oriented, no adenopathy, well-dressed, normal affect, normal respiratory effort. Right below-knee amputation stump.  No cellulitis well apposed wound edges he just has some bleeding from where the gauze was removed mild to moderate soft tissue swelling no sign of infection  Imaging: No results found. No images are attached to the encounter.  Labs: Lab Results  Component Value Date   HGBA1C 8.2 (H) 08/06/2020   REPTSTATUS 08/11/2020 FINAL 08/06/2020   REPTSTATUS 08/11/2020 FINAL 08/06/2020   CULT  08/06/2020    NO GROWTH 5 DAYS Performed at [facility], [address], [place], [place] [address]    CULT  08/06/2020    NO GROWTH 5 DAYS Performed at [facility], [address], [place], [place] [address]      Lab Results  Component Value Date   ALBUMIN 3.8 08/06/2020   ALBUMIN 3.8 08/20/2009    Lab Results  Component Value Date   MG 2.0 08/09/2020   No results found for: VD25OH  No results found for: PREALBUMIN CBC EXTENDED Latest Ref Rng & Units 09/26/2020 08/24/2020 08/09/2020  WBC 4.0 - 10.5 K/uL 9.5 - 11.5(H)  RBC 4.22 - 5.81 MIL/uL 4.61 - 4.81  HGB 13.0 - 17.0 g/dL 41.6 60.6 30.1  HCT 60.1  - 52.0 % 41.3 50.0 44.1  PLT 150 - 400 K/uL 358 - 309  NEUTROABS 1.7 - 7.7 K/uL - - -  LYMPHSABS 0.7 - 4.0 K/uL - - -     There is no height or weight on file to calculate BMI.  Orders:  No orders of the defined types were placed in this encounter.  No orders of the defined types were placed in this encounter.    Procedures: No procedures performed  Clinical Data: No additional findings.  ROS:  All other systems negative, except as noted in the HPI. Review of Systems  Objective: Vital Signs: There were no vitals taken for this visit.  Specialty Comments:  No specialty comments available.  PMFS History: Patient Active Problem List   Diagnosis Date Noted  . Osteomyelitis (HCC) 09/26/2020  . Osteomyelitis of foot, right, acute (HCC)   . Gangrene of right foot (HCC)   . PVD (peripheral vascular disease) (HCC)   . Cellulitis 08/06/2020  . Diabetes mellitus with peripheral vascular disease (HCC) 08/06/2020  . Psoriasis 08/06/2020  . Depression 08/06/2020   Past Medical History:  Diagnosis Date  . Anxiety   . Depression   . Diabetes mellitus without complication (HCC)   .  Hypertension   . Psoriasis   . Type 1 diabetes mellitus (HCC)     Family History  Problem Relation Age of Onset  . Diabetes Mother   . Cancer Mother   . COPD Father     Past Surgical History:  Procedure Laterality Date  . ABDOMINAL AORTOGRAM W/LOWER EXTREMITY N/A 08/08/2020   Procedure: ABDOMINAL AORTOGRAM W/LOWER EXTREMITY;  Surgeon: Nada Libman, MD;  [facility];  Service: Cardiovascular;  Laterality: N/A;  . AMPUTATION Right 08/24/2020   Procedure: RIGHT TRANSMETATARSAL AMPUTATION;  Surgeon: Nadara Mustard, MD;  Location: [facility] OR;  Service: Orthopedics;  Laterality: Right;  . AMPUTATION Right 09/26/2020   Procedure: RIGHT AMPUTATION BELOW KNEE;  Surgeon: Nadara Mustard, MD;  Location: [facility] OR;  Service: Orthopedics;  Laterality: Right;  . arm fracture surgery Left   .  PERIPHERAL VASCULAR ATHERECTOMY  08/08/2020   Procedure: PERIPHERAL VASCULAR ATHERECTOMY;  Surgeon: Nada Libman, MD;  [facility];  Service: Cardiovascular;;  . PERIPHERAL VASCULAR INTERVENTION  08/08/2020   Procedure: PERIPHERAL VASCULAR INTERVENTION;  Surgeon: Nada Libman, MD;  [facility];  Service: Cardiovascular;;   Social History   Occupational History  . Not on file  Tobacco Use  . Smoking status: Never Smoker  . Smokeless tobacco: Never Used  Vaping Use  . Vaping Use: Former  Substance and Sexual Activity  . Alcohol use: Not Currently    Comment: stopped drinking about amonth ago as of 11/12/  . Drug use: Not Currently    Types: Marijuana  . Sexual activity: Not on file

## 2020-10-22 ENCOUNTER — Telehealth: Payer: Self-pay | Admitting: Orthopedic Surgery

## 2020-10-22 NOTE — Telephone Encounter (Signed)
[facility] called with the following orders: She would like to do once a week for 1 week and twice a week for 2 weeks.  John Nguyen CB# [phone]

## 2020-10-23 NOTE — Telephone Encounter (Signed)
Called and lm on vm to advise verbal ok for orders below. To call with any questions.

## 2020-10-25 ENCOUNTER — Telehealth: Payer: Self-pay

## 2020-10-25 ENCOUNTER — Ambulatory Visit (INDEPENDENT_AMBULATORY_CARE_PROVIDER_SITE_OTHER): Payer: Self-pay | Admitting: Physician Assistant

## 2020-10-25 ENCOUNTER — Other Ambulatory Visit: Payer: Self-pay | Admitting: Physician Assistant

## 2020-10-25 ENCOUNTER — Encounter: Payer: Self-pay | Admitting: Orthopedic Surgery

## 2020-10-25 DIAGNOSIS — M86171 Other acute osteomyelitis, right ankle and foot: Secondary | ICD-10-CM

## 2020-10-25 MED ORDER — OXYCODONE-ACETAMINOPHEN 5-325 MG PO TABS: 1.0000 | ORAL_TABLET | Freq: Four times a day (QID) | ORAL | 0 refills | Status: AC | PRN

## 2020-10-25 NOTE — Progress Notes (Signed)
Office Visit Note   Patient: John Nguyen           Date of Birth: 1956-01-20           MRN: [id number] Visit Date: 10/25/2020              Requested by: No referring provider defined for this encounter. PCP: Pcp, No  Chief Complaint  Patient presents with  . Right Leg - Routine Post Op    09/26/20 right BKA       HPI: Patient is 4 weeks status post right below-knee amputation.  Overall he is doing well he did hit his amputation stump with in the last couple days but otherwise he has no complaint  Assessment & Plan: Visit Diagnoses: No diagnosis found.  Plan: Patient will continue with daily cleansing and dressing changes we will follow-up in 1 week.  Follow-Up Instructions: No follow-ups on file.   Ortho Exam  Patient is alert, oriented, no adenopathy, well-dressed, normal affect, normal respiratory effort. Examination of his amputation stump well approximated wound edges surgical staples are in place moderate soft tissue swelling but no cellulitis just a minimal amount of bloody drainage  Imaging: No results found. No images are attached to the encounter.  Labs: Lab Results  Component Value Date   HGBA1C 8.2 (H) 08/06/2020   REPTSTATUS 08/11/2020 FINAL 08/06/2020   REPTSTATUS 08/11/2020 FINAL 08/06/2020   CULT  08/06/2020    NO GROWTH 5 DAYS Performed at [facility], [address], [place], [place] [address]    CULT  08/06/2020    NO GROWTH 5 DAYS Performed at [facility] Lab, [address] [place], [place] [address]      Lab Results  Component Value Date   ALBUMIN 3.8 08/06/2020   ALBUMIN 3.8 08/20/2009    Lab Results  Component Value Date   MG 2.0 08/09/2020   No results found for: VD25OH  No results found for: PREALBUMIN CBC EXTENDED Latest Ref Rng & Units 09/26/2020 08/24/2020 08/09/2020  WBC 4.0 - 10.5 K/uL 9.5 - 11.5(H)  RBC 4.22 - 5.81 MIL/uL 4.61 - 4.81  HGB 13.0 - 17.0 g/dL 78.9 38.1 01.7  HCT 51.0 - 52.0 % 41.3 50.0  44.1  PLT 150 - 400 K/uL 358 - 309  NEUTROABS 1.7 - 7.7 K/uL - - -  LYMPHSABS 0.7 - 4.0 K/uL - - -     There is no height or weight on file to calculate BMI.  Orders:  No orders of the defined types were placed in this encounter.  No orders of the defined types were placed in this encounter.    Procedures: No procedures performed  Clinical Data: No additional findings.  ROS:  All other systems negative, except as noted in the HPI. Review of Systems  Objective: Vital Signs: There were no vitals taken for this visit.  Specialty Comments:  No specialty comments available.  PMFS History: Patient Active Problem List   Diagnosis Date Noted  . Osteomyelitis (HCC) 09/26/2020  . Osteomyelitis of foot, right, acute (HCC)   . Gangrene of right foot (HCC)   . PVD (peripheral vascular disease) (HCC)   . Cellulitis 08/06/2020  . Diabetes mellitus with peripheral vascular disease (HCC) 08/06/2020  . Psoriasis 08/06/2020  . Depression 08/06/2020   Past Medical History:  Diagnosis Date  . Anxiety   . Depression   . Diabetes mellitus without complication (HCC)   . Hypertension   . Psoriasis   .  Type 1 diabetes mellitus (HCC)     Family History  Problem Relation Age of Onset  . Diabetes Mother   . Cancer Mother   . COPD Father     Past Surgical History:  Procedure Laterality Date  . ABDOMINAL AORTOGRAM W/LOWER EXTREMITY N/A 08/08/2020   Procedure: ABDOMINAL AORTOGRAM W/LOWER EXTREMITY;  Surgeon: Nada Libman, MD;  [facility];  Service: Cardiovascular;  Laterality: N/A;  . AMPUTATION Right 08/24/2020   Procedure: RIGHT TRANSMETATARSAL AMPUTATION;  Surgeon: Nadara Mustard, MD;  Location: [facility] OR;  Service: Orthopedics;  Laterality: Right;  . AMPUTATION Right 09/26/2020   Procedure: RIGHT AMPUTATION BELOW KNEE;  Surgeon: Nadara Mustard, MD;  Location: [facility] OR;  Service: Orthopedics;  Laterality: Right;  . arm fracture surgery Left   . PERIPHERAL  VASCULAR ATHERECTOMY  08/08/2020   Procedure: PERIPHERAL VASCULAR ATHERECTOMY;  Surgeon: Nada Libman, MD;  [facility];  Service: Cardiovascular;;  . PERIPHERAL VASCULAR INTERVENTION  08/08/2020   Procedure: PERIPHERAL VASCULAR INTERVENTION;  Surgeon: Nada Libman, MD;  [facility];  Service: Cardiovascular;;   Social History   Occupational History  . Not on file  Tobacco Use  . Smoking status: Never Smoker  . Smokeless tobacco: Never Used  Vaping Use  . Vaping Use: Former  Substance and Sexual Activity  . Alcohol use: Not Currently    Comment: stopped drinking about amonth ago as of 11/12/  . Drug use: Not Currently    Types: Marijuana  . Sexual activity: Not on file

## 2020-10-25 NOTE — Telephone Encounter (Signed)
Pt in office today. Please see below.

## 2020-10-25 NOTE — Telephone Encounter (Signed)
Patient called he needs rx for percocet 5 to be sent to [facility] on [address] in [place] CB:[phone]

## 2020-10-25 NOTE — Telephone Encounter (Signed)
done

## 2020-11-02 ENCOUNTER — Encounter: Payer: Self-pay | Admitting: Physician Assistant

## 2020-11-02 ENCOUNTER — Ambulatory Visit (INDEPENDENT_AMBULATORY_CARE_PROVIDER_SITE_OTHER): Payer: Self-pay | Admitting: Physician Assistant

## 2020-11-02 DIAGNOSIS — S88119A Complete traumatic amputation at level between knee and ankle, unspecified lower leg, initial encounter: Secondary | ICD-10-CM

## 2020-11-02 DIAGNOSIS — Z89511 Acquired absence of right leg below knee: Secondary | ICD-10-CM

## 2020-11-02 NOTE — Progress Notes (Signed)
Office Visit Note   Patient: John Nguyen           Date of Birth: 06-28-1956           MRN: [id number] Visit Date: 11/02/2020              Requested by: No referring provider defined for this encounter. PCP: Pcp, No  No chief complaint on file.     HPI: Patient is a pleasant 65 year old gentleman who is now 5 weeks status post below-knee amputation.  He did have a setback last week because he bumped and fell hard onto his stump.  He since has been very careful and is doing better  Assessment & Plan: Visit Diagnoses: No diagnosis found.  Plan: Prescription was provided to him for [employer] for beginning of working towards a prosthetic.  He will follow-up in 3 weeks or sooner if he is having any concerns Patient is a new right transtibial  amputee.  Patient's current comorbidities are not expected to impact the ability to function with the prescribed prosthesis. Patient verbally communicates a strong desire to use a prosthesis. Patient currently requires mobility aids to ambulate without a prosthesis.  Expects not to use mobility aids with a new prosthesis.  Patient is a K2 level ambulator that will use a prosthesis to walk around their home and the community over low level environmental barriers.     Follow-Up Instructions: No follow-ups on file.   Ortho Exam  Patient is alert, oriented, no adenopathy, well-dressed, normal affect, normal respiratory effort. Examination of the amputation stump he does have a moderate amount of sensitivity at the very end of the stump where he hit it.  There is no cellulitis.  Well apposed healed incision.  He has a few areas of resolving eschar but no necrosis.  Surgical staples were removed without difficulty.  No sign of ascending cellulitis  Imaging: No results found. No images are attached to the encounter.  Labs: Lab Results  Component Value Date   HGBA1C 8.2 (H) 08/06/2020   REPTSTATUS 08/11/2020 FINAL 08/06/2020   REPTSTATUS  08/11/2020 FINAL 08/06/2020   CULT  08/06/2020    NO GROWTH 5 DAYS Performed at [facility], [address] [place], [place] [address]    CULT  08/06/2020    NO GROWTH 5 DAYS Performed at [facility], [address] [place], [place] [address]      Lab Results  Component Value Date   ALBUMIN 3.8 08/06/2020   ALBUMIN 3.8 08/20/2009    Lab Results  Component Value Date   MG 2.0 08/09/2020   No results found for: VD25OH  No results found for: PREALBUMIN CBC EXTENDED Latest Ref Rng & Units 09/26/2020 08/24/2020 08/09/2020  WBC 4.0 - 10.5 K/uL 9.5 - 11.5(H)  RBC 4.22 - 5.81 MIL/uL 4.61 - 4.81  HGB 13.0 - 17.0 g/dL 62.8 31.5 17.6  HCT 16.0 - 52.0 % 41.3 50.0 44.1  PLT 150 - 400 K/uL 358 - 309  NEUTROABS 1.7 - 7.7 K/uL - - -  LYMPHSABS 0.7 - 4.0 K/uL - - -     There is no height or weight on file to calculate BMI.  Orders:  No orders of the defined types were placed in this encounter.  No orders of the defined types were placed in this encounter.    Procedures: No procedures performed  Clinical Data: No additional findings.  ROS:  All other systems negative, except as noted in  the HPI. Review of Systems  Objective: Vital Signs: There were no vitals taken for this visit.  Specialty Comments:  No specialty comments available.  PMFS History: Patient Active Problem List   Diagnosis Date Noted  . Osteomyelitis (HCC) 09/26/2020  . Osteomyelitis of foot, right, acute (HCC)   . Gangrene of right foot (HCC)   . PVD (peripheral vascular disease) (HCC)   . Cellulitis 08/06/2020  . Diabetes mellitus with peripheral vascular disease (HCC) 08/06/2020  . Psoriasis 08/06/2020  . Depression 08/06/2020   Past Medical History:  Diagnosis Date  . Anxiety   . Depression   . Diabetes mellitus without complication (HCC)   . Hypertension   . Psoriasis   . Type 1 diabetes mellitus (HCC)     Family History  Problem Relation Age of Onset  . Diabetes  Mother   . Cancer Mother   . COPD Father     Past Surgical History:  Procedure Laterality Date  . ABDOMINAL AORTOGRAM W/LOWER EXTREMITY N/A 08/08/2020   Procedure: ABDOMINAL AORTOGRAM W/LOWER EXTREMITY;  Surgeon: Nada Libman, MD;  [facility];  Service: Cardiovascular;  Laterality: N/A;  . AMPUTATION Right 08/24/2020   Procedure: RIGHT TRANSMETATARSAL AMPUTATION;  Surgeon: Nadara Mustard, MD;  Location: [facility] OR;  Service: Orthopedics;  Laterality: Right;  . AMPUTATION Right 09/26/2020   Procedure: RIGHT AMPUTATION BELOW KNEE;  Surgeon: Nadara Mustard, MD;  Location: [facility] OR;  Service: Orthopedics;  Laterality: Right;  . arm fracture surgery Left   . PERIPHERAL VASCULAR ATHERECTOMY  08/08/2020   Procedure: PERIPHERAL VASCULAR ATHERECTOMY;  Surgeon: Nada Libman, MD;  [facility];  Service: Cardiovascular;;  . PERIPHERAL VASCULAR INTERVENTION  08/08/2020   Procedure: PERIPHERAL VASCULAR INTERVENTION;  Surgeon: Nada Libman, MD;  [facility];  Service: Cardiovascular;;   Social History   Occupational History  . Not on file  Tobacco Use  . Smoking status: Never Smoker  . Smokeless tobacco: Never Used  Vaping Use  . Vaping Use: Former  Substance and Sexual Activity  . Alcohol use: Not Currently    Comment: stopped drinking about amonth ago as of 11/12/  . Drug use: Not Currently    Types: Marijuana  . Sexual activity: Not on file

## 2020-11-23 ENCOUNTER — Ambulatory Visit: Payer: Self-pay | Admitting: Physician Assistant

## 2020-12-06 ENCOUNTER — Ambulatory Visit (INDEPENDENT_AMBULATORY_CARE_PROVIDER_SITE_OTHER): Payer: Self-pay | Admitting: Orthopedic Surgery

## 2020-12-06 DIAGNOSIS — Z89511 Acquired absence of right leg below knee: Secondary | ICD-10-CM

## 2020-12-06 DIAGNOSIS — S88119A Complete traumatic amputation at level between knee and ankle, unspecified lower leg, initial encounter: Secondary | ICD-10-CM

## 2020-12-06 MED ORDER — NITROGLYCERIN 0.2 MG/HR TD PT24: 0.2000 mg | MEDICATED_PATCH | Freq: Every day | TRANSDERMAL | 12 refills | Status: AC

## 2020-12-07 ENCOUNTER — Encounter: Payer: Self-pay | Admitting: Orthopedic Surgery

## 2020-12-07 NOTE — Progress Notes (Signed)
Office Visit Note   Patient: John Nguyen           Date of Birth: 08/04/1956           MRN: [id number] Visit Date: 12/06/2020              Requested by: No referring provider defined for this encounter. PCP: Pcp, No  Chief Complaint  Patient presents with  . Other    09/26/20 Rt BKA 08/24/20 right transmet amp      HPI: Patient is a 65 year old gentleman who is 2 months status post a right transtibial amputation patient states he is healing well but has a area of pain laterally.  He complains of phantom pain with numbness and tingling.  Patient states that he is relocating to [place] at the end of March.  Assessment & Plan: Visit Diagnoses:  1. Below knee amputation [facility])     Plan: Recommend patient apply for both Medicare and Medicaid in order to proceed with his prosthetic fitting.  We will reevaluate in the office prior to him leaving town.  Follow-Up Instructions: Return in about 3 weeks (around 12/27/2020).   Ortho Exam  Patient is alert, oriented, no adenopathy, well-dressed, normal affect, normal respiratory effort. On examination patient has a scab laterally over the incision the remaining incision is well-healed there is some mild ischemic changes to the great toe and second toe on the left foot.  A prescription is called in for nitroglycerin patch.  Imaging: No results found. No images are attached to the encounter.  Labs: Lab Results  Component Value Date   HGBA1C 8.2 (H) 08/06/2020   REPTSTATUS 08/11/2020 FINAL 08/06/2020   REPTSTATUS 08/11/2020 FINAL 08/06/2020   CULT  08/06/2020    NO GROWTH 5 DAYS Performed at [facility] Lab, [address], [place], [place] [address]    CULT  08/06/2020    NO GROWTH 5 DAYS Performed at [facility] Lab, [address], [place], [place] [address]      Lab Results  Component Value Date   ALBUMIN 3.8 08/06/2020   ALBUMIN 3.8 08/20/2009    Lab Results  Component Value Date   MG 2.0 08/09/2020    No results found for: VD25OH  No results found for: PREALBUMIN CBC EXTENDED Latest Ref Rng & Units 09/26/2020 08/24/2020 08/09/2020  WBC 4.0 - 10.5 K/uL 9.5 - 11.5(H)  RBC 4.22 - 5.81 MIL/uL 4.61 - 4.81  HGB 13.0 - 17.0 g/dL 17.4 08.1 44.8  HCT 18.5 - 52.0 % 41.3 50.0 44.1  PLT 150 - 400 K/uL 358 - 309  NEUTROABS 1.7 - 7.7 K/uL - - -  LYMPHSABS 0.7 - 4.0 K/uL - - -     There is no height or weight on file to calculate BMI.  Orders:  No orders of the defined types were placed in this encounter.  Meds ordered this encounter  Medications  . nitroGLYCERIN (NITRODUR - DOSED IN MG/24 HR) 0.2 mg/hr patch    Sig: Place 1 patch (0.2 mg total) onto the skin daily.    Dispense:  30 patch    Refill:  12     Procedures: No procedures performed  Clinical Data: No additional findings.  ROS:  All other systems negative, except as noted in the HPI. Review of Systems  Objective: Vital Signs: There were no vitals taken for this visit.  Specialty Comments:  No specialty comments available.  PMFS History: Patient Active Problem List   Diagnosis  Date Noted  . Osteomyelitis (HCC) 09/26/2020  . Osteomyelitis of foot, right, acute (HCC)   . Gangrene of right foot (HCC)   . PVD (peripheral vascular disease) (HCC)   . Cellulitis 08/06/2020  . Diabetes mellitus with peripheral vascular disease (HCC) 08/06/2020  . Psoriasis 08/06/2020  . Depression 08/06/2020   Past Medical History:  Diagnosis Date  . Anxiety   . Depression   . Diabetes mellitus without complication (HCC)   . Hypertension   . Psoriasis   . Type 1 diabetes mellitus (HCC)     Family History  Problem Relation Age of Onset  . Diabetes Mother   . Cancer Mother   . COPD Father     Past Surgical History:  Procedure Laterality Date  . ABDOMINAL AORTOGRAM W/LOWER EXTREMITY N/A 08/08/2020   Procedure: ABDOMINAL AORTOGRAM W/LOWER EXTREMITY;  Surgeon: Nada Libman, MD;  [facility];  Service:  Cardiovascular;  Laterality: N/A;  . AMPUTATION Right 08/24/2020   Procedure: RIGHT TRANSMETATARSAL AMPUTATION;  Surgeon: Nadara Mustard, MD;  Location: [facility] OR;  Service: Orthopedics;  Laterality: Right;  . AMPUTATION Right 09/26/2020   Procedure: RIGHT AMPUTATION BELOW KNEE;  Surgeon: Nadara Mustard, MD;  Location: [facility] OR;  Service: Orthopedics;  Laterality: Right;  . arm fracture surgery Left   . PERIPHERAL VASCULAR ATHERECTOMY  08/08/2020   Procedure: PERIPHERAL VASCULAR ATHERECTOMY;  Surgeon: Nada Libman, MD;  [facility];  Service: Cardiovascular;;  . PERIPHERAL VASCULAR INTERVENTION  08/08/2020   Procedure: PERIPHERAL VASCULAR INTERVENTION;  Surgeon: Nada Libman, MD;  [facility];  Service: Cardiovascular;;   Social History   Occupational History  . Not on file  Tobacco Use  . Smoking status: Never Smoker  . Smokeless tobacco: Never Used  Vaping Use  . Vaping Use: Former  Substance and Sexual Activity  . Alcohol use: Not Currently    Comment: stopped drinking about amonth ago as of 11/12/  . Drug use: Not Currently    Types: Marijuana  . Sexual activity: Not on file

## 2020-12-24 ENCOUNTER — Ambulatory Visit: Payer: Self-pay | Admitting: Nurse Practitioner

## 2020-12-27 ENCOUNTER — Ambulatory Visit (INDEPENDENT_AMBULATORY_CARE_PROVIDER_SITE_OTHER): Payer: Self-pay | Admitting: Physician Assistant

## 2020-12-27 ENCOUNTER — Other Ambulatory Visit: Payer: Self-pay

## 2020-12-27 ENCOUNTER — Encounter: Payer: Self-pay | Admitting: Orthopedic Surgery

## 2020-12-27 DIAGNOSIS — S88119A Complete traumatic amputation at level between knee and ankle, unspecified lower leg, initial encounter: Secondary | ICD-10-CM

## 2020-12-27 DIAGNOSIS — Z89511 Acquired absence of right leg below knee: Secondary | ICD-10-CM

## 2020-12-27 NOTE — Progress Notes (Signed)
Office Visit Note   Patient: John Nguyen           Date of Birth: July 30, 1956           MRN: [id number] Visit Date: 12/27/2020              Requested by: No referring provider defined for this encounter. PCP: Pcp, No  Chief Complaint  Patient presents with  . Right Leg - Routine Post Op    09/26/20 right BKA       HPI: Patient is 3 months status post right below-knee amputation.  He does not have a shrinker today and has not been wearing 1.  He overall well is healed but does have a thick eschar on the lateral side of the amputation stump  Assessment & Plan: Visit Diagnoses: No diagnosis found.  Plan: Patient was provided prescription for 2 extra-large shrinkers will follow up with Dr. Lajoyce Corners next week.  He is most likely moving to [place] at the end of the month.  Follow-Up Instructions: No follow-ups on file.   Ortho Exam  Patient is alert, oriented, no adenopathy, well-dressed, normal affect, normal respiratory effort. Right lower extremity amputation stump completely healed except for 3 cm on the lateral side with thickened eschar.  This was debrided underneath had about 75% fibrinous tissue 25% healthy vascular tissue.  No surrounding cellulitis no foul odor nothing probes deeply no signs of infection  Imaging: No results found. No images are attached to the encounter.  Labs: Lab Results  Component Value Date   HGBA1C 8.2 (H) 08/06/2020   REPTSTATUS 08/11/2020 FINAL 08/06/2020   REPTSTATUS 08/11/2020 FINAL 08/06/2020   CULT  08/06/2020    NO GROWTH 5 DAYS Performed at [facility] Lab, [address] [place], [place] [address]    CULT  08/06/2020    NO GROWTH 5 DAYS Performed at [facility], [address], [place], [place] [address]      Lab Results  Component Value Date   ALBUMIN 3.8 08/06/2020   ALBUMIN 3.8 08/20/2009    Lab Results  Component Value Date   MG 2.0 08/09/2020   No results found for: VD25OH  No results found for:  PREALBUMIN CBC EXTENDED Latest Ref Rng & Units 09/26/2020 08/24/2020 08/09/2020  WBC 4.0 - 10.5 K/uL 9.5 - 11.5(H)  RBC 4.22 - 5.81 MIL/uL 4.61 - 4.81  HGB 13.0 - 17.0 g/dL 48.5 46.2 70.3  HCT 50.0 - 52.0 % 41.3 50.0 44.1  PLT 150 - 400 K/uL 358 - 309  NEUTROABS 1.7 - 7.7 K/uL - - -  LYMPHSABS 0.7 - 4.0 K/uL - - -     There is no height or weight on file to calculate BMI.  Orders:  No orders of the defined types were placed in this encounter.  No orders of the defined types were placed in this encounter.    Procedures: No procedures performed  Clinical Data: No additional findings.  ROS:  All other systems negative, except as noted in the HPI. Review of Systems  Objective: Vital Signs: There were no vitals taken for this visit.  Specialty Comments:  No specialty comments available.  PMFS History: Patient Active Problem List   Diagnosis Date Noted  . Osteomyelitis (HCC) 09/26/2020  . Osteomyelitis of foot, right, acute (HCC)   . Gangrene of right foot (HCC)   . PVD (peripheral vascular disease) (HCC)   . Cellulitis 08/06/2020  . Diabetes mellitus with peripheral vascular disease (  HCC) 08/06/2020  . Psoriasis 08/06/2020  . Depression 08/06/2020   Past Medical History:  Diagnosis Date  . Anxiety   . Depression   . Diabetes mellitus without complication (HCC)   . Hypertension   . Psoriasis   . Type 1 diabetes mellitus (HCC)     Family History  Problem Relation Age of Onset  . Diabetes Mother   . Cancer Mother   . COPD Father     Past Surgical History:  Procedure Laterality Date  . ABDOMINAL AORTOGRAM W/LOWER EXTREMITY N/A 08/08/2020   Procedure: ABDOMINAL AORTOGRAM W/LOWER EXTREMITY;  Surgeon: Nada Libman, MD;  [facility];  Service: Cardiovascular;  Laterality: N/A;  . AMPUTATION Right 08/24/2020   Procedure: RIGHT TRANSMETATARSAL AMPUTATION;  Surgeon: Nadara Mustard, MD;  Location: [facility] OR;  Service: Orthopedics;  Laterality:  Right;  . AMPUTATION Right 09/26/2020   Procedure: RIGHT AMPUTATION BELOW KNEE;  Surgeon: Nadara Mustard, MD;  Location: [facility] OR;  Service: Orthopedics;  Laterality: Right;  . arm fracture surgery Left   . PERIPHERAL VASCULAR ATHERECTOMY  08/08/2020   Procedure: PERIPHERAL VASCULAR ATHERECTOMY;  Surgeon: Nada Libman, MD;  [facility];  Service: Cardiovascular;;  . PERIPHERAL VASCULAR INTERVENTION  08/08/2020   Procedure: PERIPHERAL VASCULAR INTERVENTION;  Surgeon: Nada Libman, MD;  [facility];  Service: Cardiovascular;;   Social History   Occupational History  . Not on file  Tobacco Use  . Smoking status: Never Smoker  . Smokeless tobacco: Never Used  Vaping Use  . Vaping Use: Former  Substance and Sexual Activity  . Alcohol use: Not Currently    Comment: stopped drinking about amonth ago as of 11/12/  . Drug use: Not Currently    Types: Marijuana  . Sexual activity: Not on file

## 2021-01-03 ENCOUNTER — Ambulatory Visit: Payer: Self-pay | Admitting: Orthopedic Surgery

## 2021-01-10 ENCOUNTER — Ambulatory Visit (INDEPENDENT_AMBULATORY_CARE_PROVIDER_SITE_OTHER): Payer: Self-pay | Admitting: Physician Assistant

## 2021-01-10 ENCOUNTER — Encounter: Payer: Self-pay | Admitting: Orthopedic Surgery

## 2021-01-10 DIAGNOSIS — Z89511 Acquired absence of right leg below knee: Secondary | ICD-10-CM

## 2021-01-10 DIAGNOSIS — S88119A Complete traumatic amputation at level between knee and ankle, unspecified lower leg, initial encounter: Secondary | ICD-10-CM

## 2021-01-10 NOTE — Progress Notes (Signed)
Office Visit Note   Patient: John Nguyen           Date of Birth: 05/23/1956           MRN: [id number] Visit Date: 01/10/2021              Requested by: No referring provider defined for this encounter. PCP: Pcp, No  Chief Complaint  Patient presents with  . Right Leg - Follow-up      HPI: Patient presents today he is over 3 months status post right below-knee amputation.  He will be moving to live with his brother at the end of April.  He is concerned that he will not be able to get a prosthetic because he does not have insurance  Assessment & Plan: Visit Diagnoses: No diagnosis found.  Plan: Patient was provided a prescription to [facility] to speak with them.  He should continue daily cleansing and dressing changes and trying to use the shrinker when he can.  Follow-up in 3 weeks  Follow-Up Instructions: No follow-ups on file.   Ortho Exam  Patient is alert, oriented, no adenopathy, well-dressed, normal affect, normal respiratory effort. Examination demonstrates overall well-healed incision.  On the very periphery of the incision he has 1 thickened area of eschar.  This was debrided and beneath he had no foul odor no cellulitis nothing that probes deeply in the small area of wound dehiscence.  Probes just below the skin.  Swelling is very well controlled no ascending cellulitis Patient is a new right transtibial  amputee.  Patient's current comorbidities are not expected to impact the ability to function with the prescribed prosthesis. Patient verbally communicates a strong desire to use a prosthesis. Patient currently requires mobility aids to ambulate without a prosthesis.  Expects not to use mobility aids with a new prosthesis.  Patient is a K2 level ambulator that will use a prosthesis to walk around their home and the community over low level environmental barriers.     Imaging: No results found. No images are attached to the encounter.  Labs: Lab Results   Component Value Date   HGBA1C 8.2 (H) 08/06/2020   REPTSTATUS 08/11/2020 FINAL 08/06/2020   REPTSTATUS 08/11/2020 FINAL 08/06/2020   CULT  08/06/2020    NO GROWTH 5 DAYS Performed at [facility], [address] [place], [place] [address]    CULT  08/06/2020    NO GROWTH 5 DAYS Performed at [facility], [address], [place], [place] [address]      Lab Results  Component Value Date   ALBUMIN 3.8 08/06/2020   ALBUMIN 3.8 08/20/2009    Lab Results  Component Value Date   MG 2.0 08/09/2020   No results found for: VD25OH  No results found for: PREALBUMIN CBC EXTENDED Latest Ref Rng & Units 09/26/2020 08/24/2020 08/09/2020  WBC 4.0 - 10.5 K/uL 9.5 - 11.5(H)  RBC 4.22 - 5.81 MIL/uL 4.61 - 4.81  HGB 13.0 - 17.0 g/dL 82.9 93.7 16.9  HCT 67.8 - 52.0 % 41.3 50.0 44.1  PLT 150 - 400 K/uL 358 - 309  NEUTROABS 1.7 - 7.7 K/uL - - -  LYMPHSABS 0.7 - 4.0 K/uL - - -     There is no height or weight on file to calculate BMI.  Orders:  No orders of the defined types were placed in this encounter.  No orders of the defined types were placed in this encounter.    Procedures: No procedures  performed  Clinical Data: No additional findings.  ROS:  All other systems negative, except as noted in the HPI. Review of Systems  Objective: Vital Signs: There were no vitals taken for this visit.  Specialty Comments:  No specialty comments available.  PMFS History: Patient Active Problem List   Diagnosis Date Noted  . Osteomyelitis (HCC) 09/26/2020  . Osteomyelitis of foot, right, acute (HCC)   . Gangrene of right foot (HCC)   . PVD (peripheral vascular disease) (HCC)   . Cellulitis 08/06/2020  . Diabetes mellitus with peripheral vascular disease (HCC) 08/06/2020  . Psoriasis 08/06/2020  . Depression 08/06/2020   Past Medical History:  Diagnosis Date  . Anxiety   . Depression   . Diabetes mellitus without complication (HCC)   . Hypertension   .  Psoriasis   . Type 1 diabetes mellitus (HCC)     Family History  Problem Relation Age of Onset  . Diabetes Mother   . Cancer Mother   . COPD Father     Past Surgical History:  Procedure Laterality Date  . ABDOMINAL AORTOGRAM W/LOWER EXTREMITY N/A 08/08/2020   Procedure: ABDOMINAL AORTOGRAM W/LOWER EXTREMITY;  Surgeon: Nada Libman, MD;  [facility];  Service: Cardiovascular;  Laterality: N/A;  . AMPUTATION Right 08/24/2020   Procedure: RIGHT TRANSMETATARSAL AMPUTATION;  Surgeon: Nadara Mustard, MD;  Location: [facility] OR;  Service: Orthopedics;  Laterality: Right;  . AMPUTATION Right 09/26/2020   Procedure: RIGHT AMPUTATION BELOW KNEE;  Surgeon: Nadara Mustard, MD;  Location: [facility] OR;  Service: Orthopedics;  Laterality: Right;  . arm fracture surgery Left   . PERIPHERAL VASCULAR ATHERECTOMY  08/08/2020   Procedure: PERIPHERAL VASCULAR ATHERECTOMY;  Surgeon: Nada Libman, MD;  [facility];  Service: Cardiovascular;;  . PERIPHERAL VASCULAR INTERVENTION  08/08/2020   Procedure: PERIPHERAL VASCULAR INTERVENTION;  Surgeon: Nada Libman, MD;  [facility];  Service: Cardiovascular;;   Social History   Occupational History  . Not on file  Tobacco Use  . Smoking status: Never Smoker  . Smokeless tobacco: Never Used  Vaping Use  . Vaping Use: Former  Substance and Sexual Activity  . Alcohol use: Not Currently    Comment: stopped drinking about amonth ago as of 11/12/  . Drug use: Not Currently    Types: Marijuana  . Sexual activity: Not on file

## 2021-01-31 ENCOUNTER — Ambulatory Visit (INDEPENDENT_AMBULATORY_CARE_PROVIDER_SITE_OTHER): Payer: Self-pay | Admitting: Orthopedic Surgery

## 2021-01-31 DIAGNOSIS — Z89511 Acquired absence of right leg below knee: Secondary | ICD-10-CM

## 2021-01-31 DIAGNOSIS — S88119A Complete traumatic amputation at level between knee and ankle, unspecified lower leg, initial encounter: Secondary | ICD-10-CM

## 2021-02-01 ENCOUNTER — Encounter: Payer: Self-pay | Admitting: Orthopedic Surgery

## 2021-02-01 MED ORDER — GABAPENTIN 100 MG PO CAPS: 100.0000 mg | ORAL_CAPSULE | Freq: Three times a day (TID) | ORAL | 3 refills | Status: AC

## 2021-02-01 NOTE — Progress Notes (Signed)
Office Visit Note   Patient: John Nguyen           Date of Birth: 15-Oct-1955           MRN: [id number] Visit Date: 01/31/2021              Requested by: No referring provider defined for this encounter. PCP: Pcp, No  Chief Complaint  Patient presents with  . Right Knee - Follow-up      HPI: Patient is a 65 year old gentleman who presents 4 months status post right transtibial amputation.  Patient states he still has a little scab laterally with some drainage.  Assessment & Plan: Visit Diagnoses:  1. Below knee amputation [facility])     Plan: We will call in a prescription for Neurontin for his neuropathic pain continue with the stump shrinker follow-up with [facility] in 2 weeks to be casted for prosthetic.  Recommended probiotics for his constipation secondary to the narcotic pain medicine.  He is given a application for handicap parking.  Follow-Up Instructions: Return in about 4 weeks (around 02/28/2021).   Ortho Exam  Patient is alert, oriented, no adenopathy, well-dressed, normal affect, normal respiratory effort. Examination the residual limb is well consolidated there is a small scab laterally that is superficial 10 mm in diameter 0.1 mm deep.  There is no drainage or cellulitis no signs of infection.  Imaging: No results found. No images are attached to the encounter.  Labs: Lab Results  Component Value Date   HGBA1C 8.2 (H) 08/06/2020   REPTSTATUS 08/11/2020 FINAL 08/06/2020   REPTSTATUS 08/11/2020 FINAL 08/06/2020   CULT  08/06/2020    NO GROWTH 5 DAYS Performed at [facility] Lab, [address], [place], [place] [address]    CULT  08/06/2020    NO GROWTH 5 DAYS Performed at [facility], [address] [place], [place] [address]      Lab Results  Component Value Date   ALBUMIN 3.8 08/06/2020   ALBUMIN 3.8 08/20/2009    Lab Results  Component Value Date   MG 2.0 08/09/2020   No results found for: VD25OH  No results found for:  PREALBUMIN CBC EXTENDED Latest Ref Rng & Units 09/26/2020 08/24/2020 08/09/2020  WBC 4.0 - 10.5 K/uL 9.5 - 11.5(H)  RBC 4.22 - 5.81 MIL/uL 4.61 - 4.81  HGB 13.0 - 17.0 g/dL 25.8 52.7 78.2  HCT 42.3 - 52.0 % 41.3 50.0 44.1  PLT 150 - 400 K/uL 358 - 309  NEUTROABS 1.7 - 7.7 K/uL - - -  LYMPHSABS 0.7 - 4.0 K/uL - - -     There is no height or weight on file to calculate BMI.  Orders:  No orders of the defined types were placed in this encounter.  No orders of the defined types were placed in this encounter.    Procedures: No procedures performed  Clinical Data: No additional findings.  ROS:  All other systems negative, except as noted in the HPI. Review of Systems  Objective: Vital Signs: There were no vitals taken for this visit.  Specialty Comments:  No specialty comments available.  PMFS History: Patient Active Problem List   Diagnosis Date Noted  . Osteomyelitis (HCC) 09/26/2020  . Osteomyelitis of foot, right, acute (HCC)   . Gangrene of right foot (HCC)   . PVD (peripheral vascular disease) (HCC)   . Cellulitis 08/06/2020  . Diabetes mellitus with peripheral vascular disease (HCC) 08/06/2020  . Psoriasis 08/06/2020  . Depression  08/06/2020   Past Medical History:  Diagnosis Date  . Anxiety   . Depression   . Diabetes mellitus without complication (HCC)   . Hypertension   . Psoriasis   . Type 1 diabetes mellitus (HCC)     Family History  Problem Relation Age of Onset  . Diabetes Mother   . Cancer Mother   . COPD Father     Past Surgical History:  Procedure Laterality Date  . ABDOMINAL AORTOGRAM W/LOWER EXTREMITY N/A 08/08/2020   Procedure: ABDOMINAL AORTOGRAM W/LOWER EXTREMITY;  Surgeon: Nada Libman, MD;  [facility];  Service: Cardiovascular;  Laterality: N/A;  . AMPUTATION Right 08/24/2020   Procedure: RIGHT TRANSMETATARSAL AMPUTATION;  Surgeon: Nadara Mustard, MD;  Location: [facility] OR;  Service: Orthopedics;  Laterality:  Right;  . AMPUTATION Right 09/26/2020   Procedure: RIGHT AMPUTATION BELOW KNEE;  Surgeon: Nadara Mustard, MD;  Location: [facility] OR;  Service: Orthopedics;  Laterality: Right;  . arm fracture surgery Left   . PERIPHERAL VASCULAR ATHERECTOMY  08/08/2020   Procedure: PERIPHERAL VASCULAR ATHERECTOMY;  Surgeon: Nada Libman, MD;  [facility];  Service: Cardiovascular;;  . PERIPHERAL VASCULAR INTERVENTION  08/08/2020   Procedure: PERIPHERAL VASCULAR INTERVENTION;  Surgeon: Nada Libman, MD;  [facility];  Service: Cardiovascular;;   Social History   Occupational History  . Not on file  Tobacco Use  . Smoking status: Never Smoker  . Smokeless tobacco: Never Used  Vaping Use  . Vaping Use: Former  Substance and Sexual Activity  . Alcohol use: Not Currently    Comment: stopped drinking about amonth ago as of 11/12/  . Drug use: Not Currently    Types: Marijuana  . Sexual activity: Not on file

## 2021-02-25 ENCOUNTER — Telehealth: Payer: Self-pay | Admitting: Physician Assistant

## 2021-02-25 NOTE — Telephone Encounter (Signed)
Pt called and was wondering since he moved to [place] can he just send mary ann a photo of his wound. I informed pt she would probably like to see him in person. CB [phone]

## 2021-02-25 NOTE — Telephone Encounter (Signed)
Pt called and states that there is no way possible for him to come to [place]. He would like a call from someone to discuss where to go from here. CB [phone]

## 2021-02-25 NOTE — Telephone Encounter (Signed)
Pls advise.  

## 2021-02-25 NOTE — Telephone Encounter (Signed)
Yes ma'am she needs to actually see her in person

## 2021-02-25 NOTE — Telephone Encounter (Signed)
Prefer to see in person if at all possible

## 2021-02-26 NOTE — Telephone Encounter (Signed)
If patient has concerns about stump enough that he wants to send a picture, we need to see him in the office. Alternative is for him to be seen by an orthopedist near to where he is now living.

## 2021-02-26 NOTE — Telephone Encounter (Signed)
LMOM of the below message for patient  

## 2021-02-28 ENCOUNTER — Ambulatory Visit: Payer: Self-pay | Admitting: Physician Assistant

## 2021-05-17 ENCOUNTER — Encounter (HOSPITAL_COMMUNITY): Payer: Self-pay

## 2021-05-17 ENCOUNTER — Ambulatory Visit (HOSPITAL_COMMUNITY)
Admission: RE | Admit: 2021-05-17 | Payer: Self-pay | Source: Ambulatory Visit | Attending: Physician Assistant | Admitting: Physician Assistant

## 2021-07-17 IMAGING — DX DG FOOT COMPLETE 3+V*R*
3 series · 3 of 3 positions shown · non-contrast
Comparison: Radiograph 07/27/2020

CLINICAL DATA: Cellulitis, first ray necrosis

EXAM:
RIGHT FOOT COMPLETE - 3+ VIEW

[foot ap]
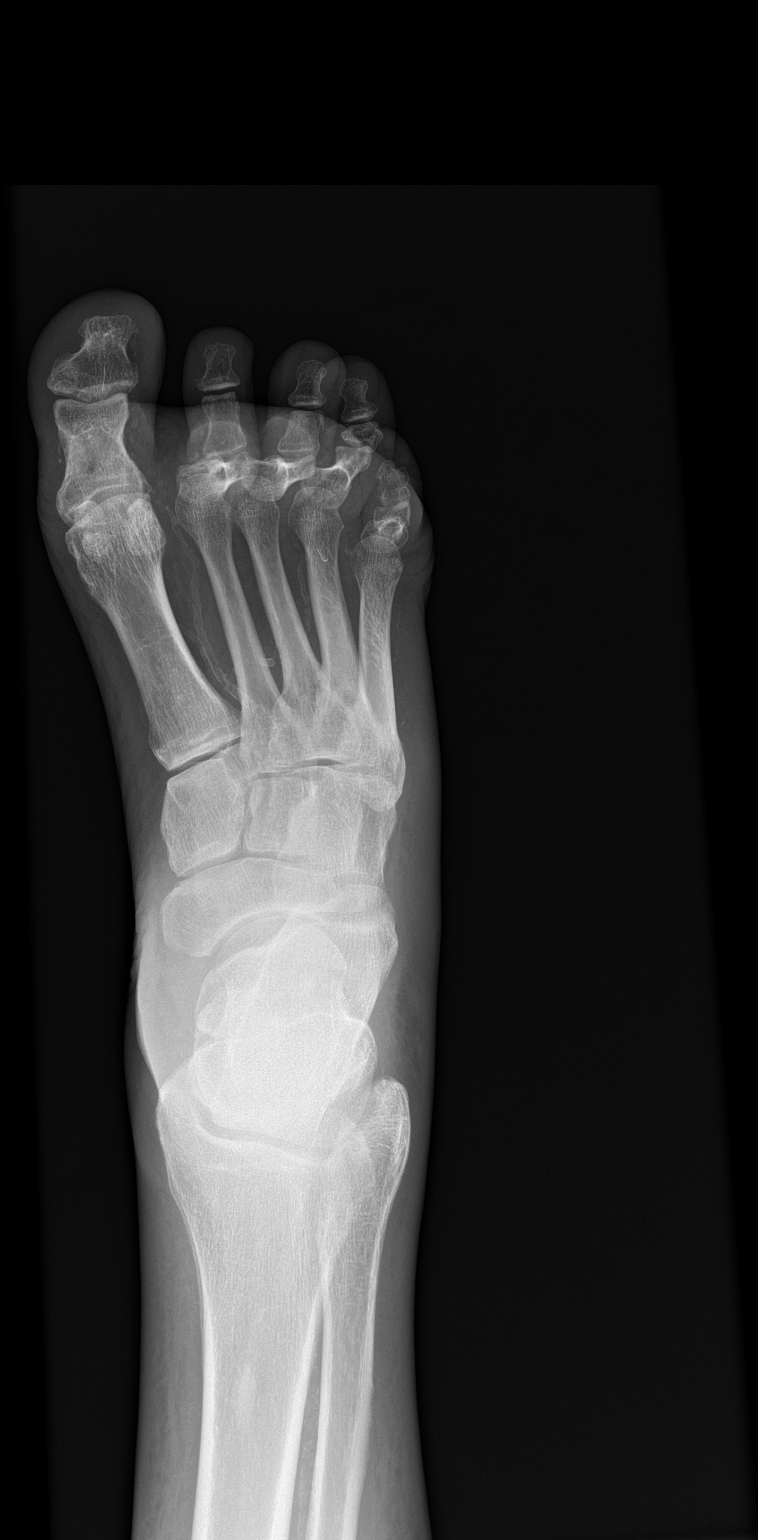

[foot obl]
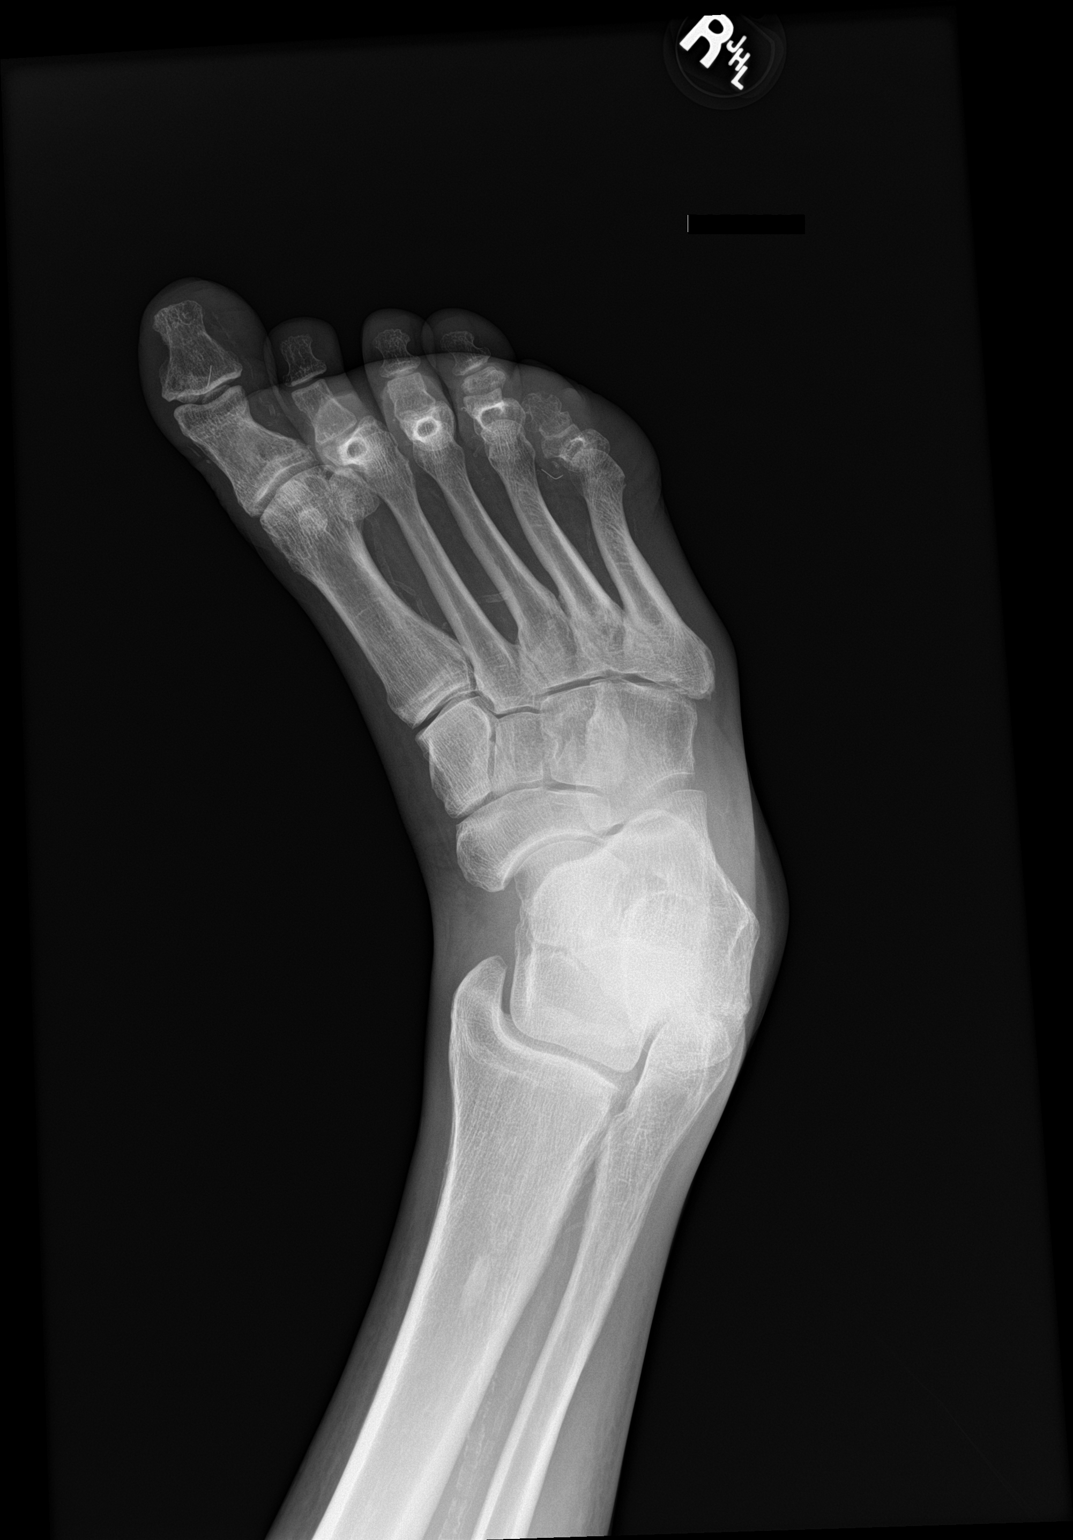

[foot lat]
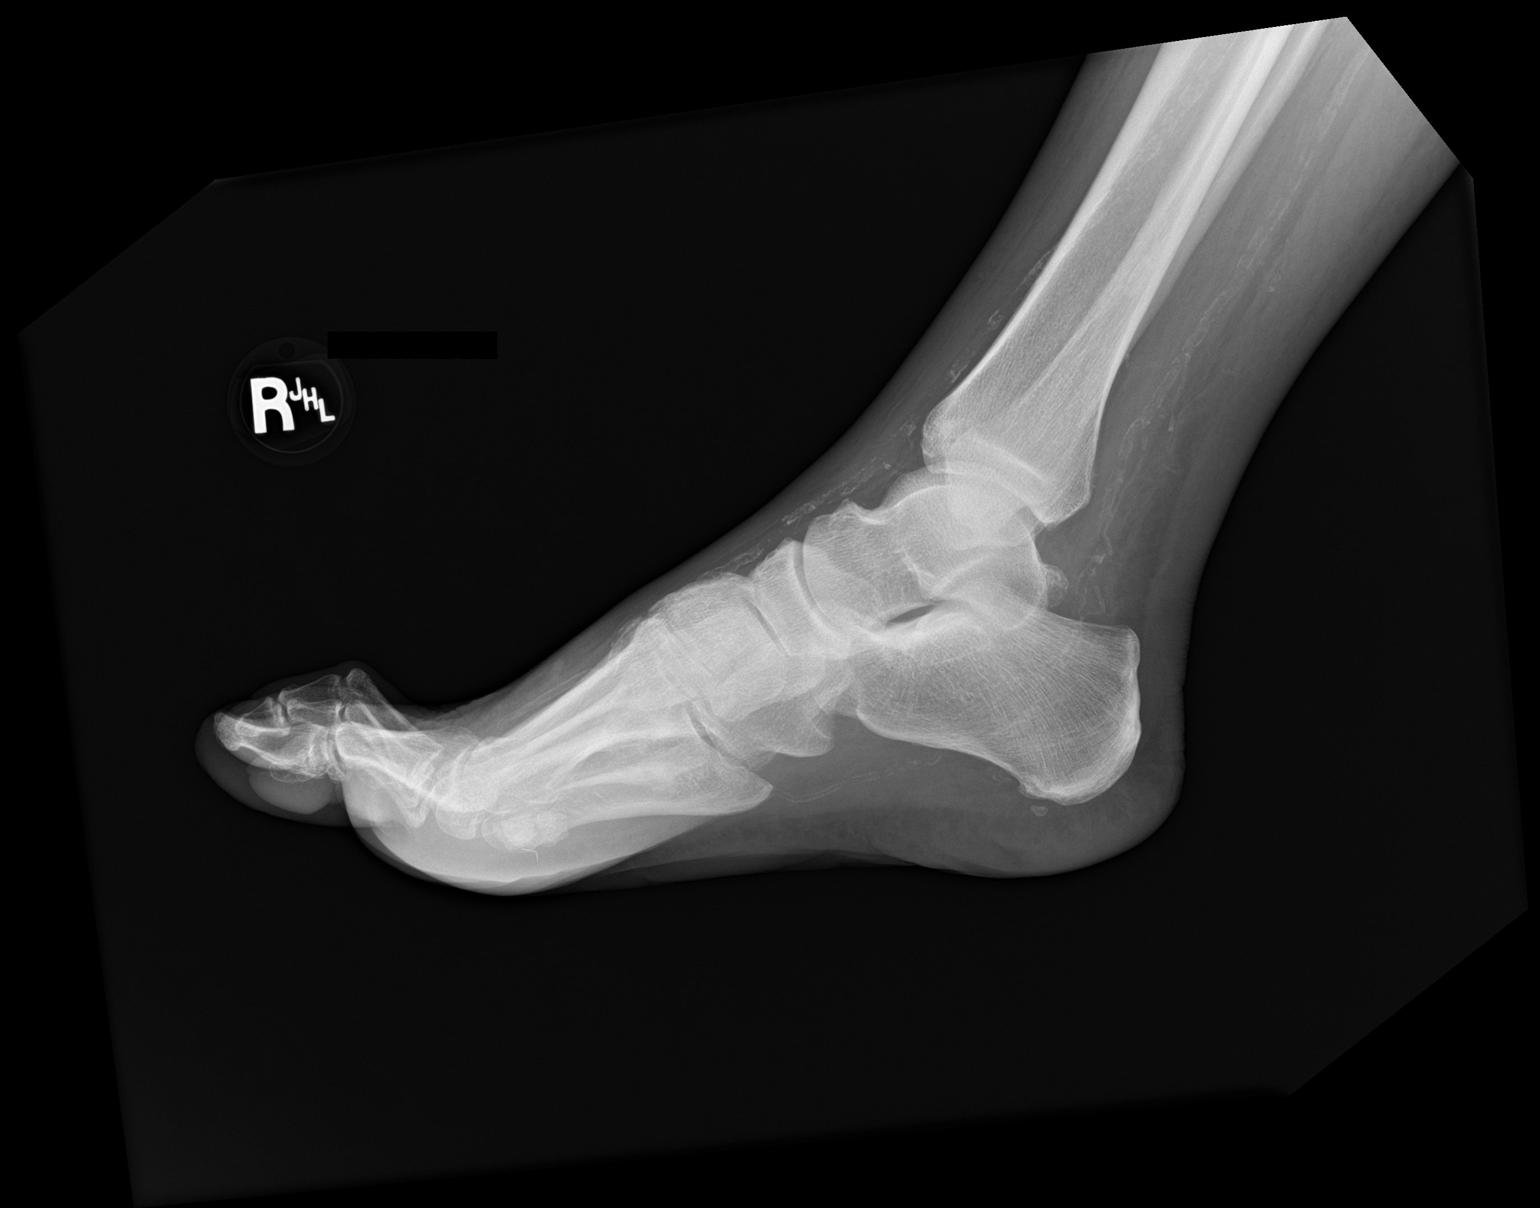

[3 of 3 positions shown; findings below may reference images not displayed]

FINDINGS: Soft tissue swelling of the first digit without clear evidence of
osteomyelitis. Small cortical erosion noted at the head of the fifth
metatarsal, nonspecific in can be seen with sequela of prior
infection/osteomyelitis as well as erosive arthropathy.
Redemonstrated linear foreign body seen along the plantar aspect at
the metatarsal heads and at the plantar aspect of the first
interphalangeal joint. Progressive clawtoe deformities of the second
through fifth ray. Additional varus deformity of the fifth ray
noted, progressive from prior imaging.
IMPRESSION: 1. Soft tissue swelling of the first digit without clear evidence of
osteomyelitis.
2. Small cortical erosion noted at the head of the fifth metatarsal,
nonspecific in can be seen with sequela of prior
infection/osteomyelitis as well as erosive arthropathy.
3. Stable indeterminate foreign bodies in the volar aspect of the
first and fifth rays.
4. Quintus varus and progressive clawtoe deformities of the second
through fifth ray.
